# Patient Record
Sex: Female | Born: 1950
Health system: Southern US, Community
[De-identification: ages and names within clinical notes are randomized; demographics above are authoritative.]

## PROBLEM LIST (undated history)

## (undated) DIAGNOSIS — K219 Gastro-esophageal reflux disease without esophagitis: Secondary | ICD-10-CM

## (undated) HISTORY — PX: EYE SURGERY: SHX253

## (undated) HISTORY — PX: ABDOMINAL HYSTERECTOMY: SHX81

## (undated) HISTORY — PX: LAPAROSCOPIC OOPHERECTOMY: SHX6507

## (undated) HISTORY — DX: Gastro-esophageal reflux disease without esophagitis: K21.9

---

## 1981-02-17 HISTORY — PX: TYMPANOPLASTY: SHX33

## 2004-11-28 ENCOUNTER — Ambulatory Visit: Payer: Self-pay | Admitting: Internal Medicine

## 2004-11-29 ENCOUNTER — Ambulatory Visit: Payer: Self-pay | Admitting: Internal Medicine

## 2006-05-01 ENCOUNTER — Ambulatory Visit: Payer: Self-pay | Admitting: Unknown Physician Specialty

## 2010-04-25 ENCOUNTER — Ambulatory Visit: Payer: Self-pay | Admitting: Family Medicine

## 2011-04-21 ENCOUNTER — Ambulatory Visit: Payer: Self-pay | Admitting: Ophthalmology

## 2011-05-19 ENCOUNTER — Ambulatory Visit: Payer: Self-pay | Admitting: Ophthalmology

## 2011-06-11 ENCOUNTER — Ambulatory Visit: Payer: Self-pay | Admitting: Family Medicine

## 2011-06-19 ENCOUNTER — Ambulatory Visit: Payer: Self-pay | Admitting: Family Medicine

## 2011-12-25 ENCOUNTER — Ambulatory Visit: Payer: Self-pay | Admitting: Family Medicine

## 2013-02-07 ENCOUNTER — Ambulatory Visit: Payer: Self-pay | Admitting: Family Medicine

## 2014-02-14 ENCOUNTER — Ambulatory Visit: Payer: Self-pay | Admitting: Family Medicine

## 2014-06-11 NOTE — Op Note (Signed)
PATIENT NAME:  Christina Webb, Christina Webb MR#:  [id number] DATE OF BIRTH:  20-Jul-1950  DATE OF PROCEDURE:  04/21/2011  PREOPERATIVE DIAGNOSIS:  Cataract, right eye.   POSTOPERATIVE DIAGNOSIS:  Cataract, right eye.  PROCEDURE PERFORMED:  Extracapsular cataract extraction using phacoemulsification with placement of an Alcon SN6CWS, 12.0-diopter posterior chamber lens, serial # [id number].  SURGEON:  Maylon PeppersSteven A. Shiv Shuey, MD  ASSISTANT:  None.  ANESTHESIA:  4% lidocaine and 0.75% Marcaine in a 50/50 mixture with 10 units/mL of Hylenex added, given as a peribulbar.   ANESTHESIOLOGIST:  Dr. Dimple Caseyice  COMPLICATIONS:  None.  ESTIMATED BLOOD LOSS:  Less than 1 ml.  DESCRIPTION OF PROCEDURE:  The patient was brought to the operating room and given a peribulbar block.  The patient was then prepped and draped in the usual fashion.  The vertical rectus muscles were imbricated using 5-0 silk sutures.  These sutures were then clamped to the sterile drapes as bridle sutures.  A limbal peritomy was performed extending two clock hours and hemostasis was obtained with cautery.  A partial thickness scleral groove was made at the surgical limbus and dissected anteriorly in a lamellar dissection using an Alcon crescent knife.  The anterior chamber was entered superonasally with a Superblade and through the lamellar dissection with a 2.6 mm keratome.  DisCoVisc was used to replace the aqueous and a continuous tear capsulorrhexis was carried out.  Hydrodissection and hydrodelineation were carried out with balanced salt and a 27 gauge canula.  The nucleus was rotated to confirm the effectiveness of the hydrodissection.  Phacoemulsification was carried out using a divide-and-conquer technique.  Total ultrasound time was 1 minute and 43.9 seconds with an average power of 20.3 percent and CDE of 32.49.  Irrigation/aspiration was used to remove the residual cortex.  DisCoVisc was used to inflate the capsule and the internal  incision was enlarged to 3 mm with the crescent knife.  The intraocular lens was folded and inserted into the capsular bag using the AcrySert delivery system. Irrigation/aspiration was used to remove the residual DisCoVisc.  Miostat was injected into the anterior chamber through the paracentesis track to inflate the anterior chamber and induce miosis.  The wound was checked for leaks and none were found. The conjunctiva was closed with cautery and the bridle sutures were removed.  Two drops of 0.3% Vigamox were placed on the eye.   An eye shield was placed on the eye.  The patient was discharged to the recovery room in good condition.  ____________________________ Maylon PeppersSteven A. Gustaf Mccarter, MD sad:slb D: 04/21/2011 13:10:58 ET T: 04/21/2011 13:49:35 ET JOB#: [id number]  cc: Christina SpareSteven A. Roneshia Drew, MD, <Dictator> Erline LevineSTEVEN A Alesana Magistro MD ELECTRONICALLY SIGNED 04/22/2011 14:18

## 2014-06-11 NOTE — Op Note (Signed)
PATIENT NAME:  Christina OchsHOMPSON, Jakyrah MR#:  [id number] DATE OF BIRTH:  28-Oct-1950  DATE OF PROCEDURE:  05/19/2011  PREOPERATIVE DIAGNOSIS:  Cataract, left eye.    POSTOPERATIVE DIAGNOSIS:  Cataract, left eye.  PROCEDURE PERFORMED:  Extracapsular cataract extraction using phacoemulsification with placement of an Alcon SN6CWS, 12.0-diopter posterior chamber lens, serial # [id number].  SURGEON:  Maylon PeppersSteven A. Bianca Raneri, MD  ASSISTANT:  None.  ANESTHESIA:  4% lidocaine and 0.75% Marcaine in a 50/50 mixture with 10 units per mL of Hylenex added, given as a peribulbar.  ANESTHESIOLOGIST:  Dr. Henrene HawkingKephart   COMPLICATIONS:  None.  ESTIMATED BLOOD LOSS:  Less than 1 mL.  DESCRIPTION OF PROCEDURE:  The patient was brought to the operating room and given a peribulbar block.  The patient was then prepped and draped in the usual fashion.  The vertical rectus muscles were imbricated using 5-0 silk sutures.  These sutures were then clamped to the sterile drapes as bridle sutures.  A limbal peritomy was performed extending two clock hours and hemostasis was obtained with cautery.  A partial thickness scleral groove was made at the surgical limbus and dissected anteriorly in a lamellar dissection using an Alcon crescent knife.  The anterior chamber was entered supero-temporally with a Superblade and through the lamellar dissection with a 2.6 mm keratome.  DisCoVisc was used to replace the aqueous and a continuous tear capsulorrhexis was carried out.  Hydrodissection and hydrodelineation were carried out with balanced salt and a 27 gauge canula.  The nucleus was rotated to confirm the effectiveness of the hydrodissection.  Phacoemulsification was carried out using a divide-and-conquer technique.  Total ultrasound time was 1 minute and 30 seconds with an average power of 23.9 percent, CDE 33.82.  Irrigation/aspiration was used to remove the residual cortex.  DisCoVisc was used to inflate the capsule and the internal  incision was enlarged to 3 mm with the crescent knife.  The intraocular lens was folded and inserted into the capsular bag using the AcrySert delivery system.  Irrigation/aspiration was used to remove the residual DisCoVisc.  Miostat was injected into the anterior chamber through the paracentesis track to inflate the anterior chamber and induce miosis.  The wound was checked for leaks and none were found. The conjunctiva was closed with cautery and the bridle sutures were removed.  Two drops of 0.3% Vigamox were placed on the eye.   An eye shield was placed on the eye.  The patient was discharged to the recovery room in good condition.  ____________________________ Maylon PeppersSteven A. Jai Steil, MD sad:drc D: 05/19/2011 13:12:27 ET T: 05/19/2011 13:52:51 ET JOB#: [id number]  cc: Viviann SpareSteven A. Dovey Fatzinger, MD, <Dictator> Erline LevineSTEVEN A Sujata Maines MD ELECTRONICALLY SIGNED 05/20/2011 13:56

## 2015-01-23 ENCOUNTER — Other Ambulatory Visit: Payer: Self-pay | Admitting: Family Medicine

## 2015-01-23 DIAGNOSIS — Z1231 Encounter for screening mammogram for malignant neoplasm of breast: Secondary | ICD-10-CM

## 2015-02-26 ENCOUNTER — Ambulatory Visit: Payer: Self-pay

## 2015-03-06 ENCOUNTER — Emergency Department
Admission: EM | Admit: 2015-03-06 | Discharge: 2015-03-06 | Disposition: A | Discharge status: Home / Self Care | Payer: 59 | Attending: Emergency Medicine | Admitting: Emergency Medicine

## 2015-03-06 ENCOUNTER — Emergency Department: Payer: 59

## 2015-03-06 ENCOUNTER — Encounter: Payer: Self-pay | Admitting: Emergency Medicine

## 2015-03-06 DIAGNOSIS — R079 Chest pain, unspecified: Secondary | ICD-10-CM | POA: Diagnosis present

## 2015-03-06 DIAGNOSIS — Z7982 Long term (current) use of aspirin: Secondary | ICD-10-CM | POA: Insufficient documentation

## 2015-03-06 LAB — BASIC METABOLIC PANEL
Anion gap: 6 (ref 5–15)
BUN: 20 mg/dL (ref 6–20)
CHLORIDE: 109 mmol/L (ref 101–111)
CO2: 27 mmol/L (ref 22–32)
CREATININE: 0.55 mg/dL (ref 0.44–1.00)
Calcium: 9.3 mg/dL (ref 8.9–10.3)
GFR calc Af Amer: >60 mL/min (ref >60)
GLUCOSE: 119 mg/dL — AB (ref 65–99)
Potassium: 3.5 mmol/L (ref 3.5–5.1)
SODIUM: 142 mmol/L (ref 135–145)

## 2015-03-06 LAB — CBC
HEMATOCRIT: 41.3 % (ref 35.0–47.0)
Hemoglobin: 13.6 g/dL (ref 12.0–16.0)
MCH: 29.3 pg (ref 26.0–34.0)
MCHC: 33 g/dL (ref 32.0–36.0)
MCV: 88.9 fL (ref 80.0–100.0)
PLATELETS: 218 10*3/uL (ref 150–440)
RBC: 4.65 MIL/uL (ref 3.80–5.20)
RDW: 13.1 % (ref 11.5–14.5)
WBC: 5.4 10*3/uL (ref 3.6–11.0)

## 2015-03-06 LAB — TROPONIN I
Troponin I: <0.03 ng/mL (ref <0.031)
Troponin I: <0.03 ng/mL (ref <0.031)

## 2015-03-06 NOTE — ED Notes (Signed)

## 2015-03-06 NOTE — ED Notes (Addendum)
Pt c/o intermittent left sided chest pain today.  Denies any other sx with chest pain.  Was at [facility] for CP and they sent her here.  Denies cardiac hx.

## 2015-03-06 NOTE — Discharge Instructions (Signed)

## 2015-03-06 NOTE — ED Provider Notes (Signed)
Time Seen: Approximately 2040  I have reviewed the triage notes  Chief Complaint: Chest Pain   History of Present Illness: Christina Webb is a 65 y.o. female *who states brief intermittent left-sided chest discomfort that started earlier this morning. She states the characteristics of her pain seemed to be consistent with her gastroesophageal reflux disease. She states it seemed to go away just as quickly as arrived and describes it as brief and transient. She states then she had another episode at 3:30 and went to an urgent care center and referred here to the emergency department. She states also the second episode was transient in nature she denies any lightheadedness, weakness, shortness of breath, diaphoresis, or radiation of her discomfort. She states her pain is resolved at this time and dad denies any past cardiac history. She denies any radiation of pain to the arm, jaw, neck region. History reviewed. No pertinent past medical history.  There are no active problems to display for this patient.   Past Surgical History  Procedure Laterality Date  . Abdominal hysterectomy    . Cesarean section    . Laparoscopic oopherectomy      Past Surgical History  Procedure Laterality Date  . Abdominal hysterectomy    . Cesarean section    . Laparoscopic oopherectomy      Current Outpatient Rx  Name  Route  Sig  Dispense  Refill  . aspirin EC 81 MG tablet   Oral   Take 81 mg by mouth at bedtime.         Marland Kitchen omeprazole (PRILOSEC) 20 MG capsule   Oral   Take 20 mg by mouth daily as needed (for acid reflux).           Allergies:  Review of patient's allergies indicates no known allergies.  Family History: History reviewed. No pertinent family history.  Social History: Social History  Substance Use Topics  . Smoking status: Never Smoker   . Smokeless tobacco: None  . Alcohol Use: No     Review of Systems:   10 point review of systems was performed and was  otherwise negative:  Constitutional: No fever Eyes: No visual disturbances ENT: No sore throat, ear pain Cardiac: No chest pain Respiratory: No shortness of breath, wheezing, or stridor Abdomen: No abdominal pain, no vomiting, No diarrhea Endocrine: No weight loss, No night sweats Extremities: No peripheral edema, cyanosis Skin: No rashes, easy bruising Neurologic: No focal weakness, trouble with speech or swollowing Urologic: No dysuria, Hematuria, or urinary frequency   Physical Exam:  ED Triage Vitals  Enc Vitals Group     BP 03/06/15 1711 154/87 mmHg     Pulse Rate 03/06/15 1711 91     Resp 03/06/15 1711 18     Temp 03/06/15 1711 97.9 F (36.6 C)     Temp Source 03/06/15 1711 Oral     SpO2 03/06/15 1711 99 %     Weight 03/06/15 1711 140 lb (63.504 kg)     Height 03/06/15 1711 5' 5.5" (1.664 m)     Head Cir --      Peak Flow --      Pain Score 03/06/15 1712 6     Pain Loc --      Pain Edu? --      Excl. in GC? --     General: Awake , Alert , and Oriented times 3; GCS 15 Head: Normal cephalic , atraumatic Eyes: Pupils equal , round, reactive to light  Nose/Throat: No nasal drainage, patent upper airway without erythema or exudate.  Neck: Supple, Full range of motion, No anterior adenopathy or palpable thyroid masses Lungs: Clear to ascultation without wheezes , rhonchi, or rales Heart: Regular rate, regular rhythm without murmurs , gallops , or rubs Abdomen: Soft, non tender without rebound, guarding , or rigidity; bowel sounds positive and symmetric in all 4 quadrants. No organomegaly .        Extremities: 2 plus symmetric pulses. No edema, clubbing or cyanosis Neurologic: normal ambulation, Motor symmetric without deficits, sensory intact Skin: warm, dry, no rashes   Labs:   All laboratory work was reviewed including any pertinent negatives or positives listed below:  Labs Reviewed  BASIC METABOLIC PANEL - Abnormal; Notable for the following:    Glucose, Bld  119 (*)    All other components within normal limits  CBC  TROPONIN I  TROPONIN I   Review of laboratory work including serial troponins was negative EKG:  ED ECG REPORT I, Jennye Moccasin, the attending physician, personally viewed and interpreted this ECG.  Date: 03/06/2015 EKG Time: 1721 Rate: *97 Rhythm: normal sinus rhythm QRS Axis: normal Intervals: normal ST/T Wave abnormalities: Nonspecific ST-T wave abnormality Conduction Disutrbances: none Narrative Interpretation: unremarkable No acute ischemic changes   Radiology:  CLINICAL DATA: Left-sided chest pain  EXAM: CHEST 2 VIEW  COMPARISON: None.  FINDINGS: There is no edema or consolidation. The heart size and pulmonary vascularity are normal. No pneumothorax. No adenopathy. No bone lesions.  IMPRESSION: No edema or consolidation.   Electronically Signed    I personally reviewed the radiologic studies     ED Course:  Differential includes all life-threatening causes for chest pain. This includes but is not exclusive to acute coronary syndrome, aortic dissection, pulmonary embolism, cardiac tamponade, community-acquired pneumonia, pericarditis, musculoskeletal chest wall pain, etc. Given the patient's current clinical presentation and objective findings I felt this was unlikely to be a life-threatening cause for her chest discomfort. She describes the discomfort is similar to previous gastroesophageal reflux though was more toward the left side. The patient's serial troponins and EKG show no indications of acute coronary syndrome and she clinically does not present with other life-threatening causes such as pulmonary embolism, etc. Was advised workup is not complete and she is to follow-up with her primary physician and/or cardiologist for definitive outpatient evaluation    Assessment:  Acute unspecified chest pain  Final Clinical Impression:   Final diagnoses:  Chest pain, unspecified chest pain  type     Plan:  Outpatient management Patient was advised to return immediately if condition worsens. Patient was advised to follow up with their primary care physician or other specialized physicians involved in their outpatient care             Jennye Moccasin, MD 03/06/15 2224

## 2015-03-06 NOTE — ED Notes (Signed)
Pt presents to ED with c/o left sided intermittent chest pain since this morning, pt reports "I thought it was my acid reflux but it got worse this afternoon about 3pm" Pt denies chest pain at this time. Pt denies dizziness, weakness, shortness of breath, or blurry vision. Pt was seen by PCP and referred to ED for further evaluation.  Pt alert and oriented x 4, skin warm and dry, no increased work in breathing noted. Spouse at bedside. Call bell within reach.

## 2015-03-12 ENCOUNTER — Ambulatory Visit
Admission: RE | Admit: 2015-03-12 | Discharge: 2015-03-12 | Disposition: A | Discharge status: Home / Self Care | Payer: 59 | Source: Ambulatory Visit | Attending: Family Medicine | Admitting: Family Medicine

## 2015-03-12 ENCOUNTER — Encounter: Payer: Self-pay | Admitting: Family Medicine

## 2015-03-12 DIAGNOSIS — Z1231 Encounter for screening mammogram for malignant neoplasm of breast: Secondary | ICD-10-CM | POA: Insufficient documentation

## 2015-06-04 ENCOUNTER — Encounter: Payer: Self-pay | Admitting: Family Medicine

## 2015-06-04 ENCOUNTER — Ambulatory Visit (INDEPENDENT_AMBULATORY_CARE_PROVIDER_SITE_OTHER): Payer: 59 | Admitting: Family Medicine

## 2015-06-04 VITALS — BP 126/85 | HR 69 | Temp 97.6°F | Ht 63.8 in | Wt 144.0 lb

## 2015-06-04 DIAGNOSIS — Z0000 Encounter for general adult medical examination without abnormal findings: Secondary | ICD-10-CM

## 2015-06-04 DIAGNOSIS — K219 Gastro-esophageal reflux disease without esophagitis: Secondary | ICD-10-CM | POA: Diagnosis not present

## 2015-06-04 LAB — URINALYSIS, ROUTINE W REFLEX MICROSCOPIC
Bilirubin, UA: NEGATIVE
Glucose, UA: NEGATIVE
Ketones, UA: NEGATIVE
Nitrite, UA: NEGATIVE
Protein, UA: NEGATIVE
RBC, UA: NEGATIVE
Specific Gravity, UA: 1.02 (ref 1.005–1.030)
Urobilinogen, Ur: 0.2 mg/dL (ref 0.2–1.0)
pH, UA: 7 (ref 5.0–7.5)

## 2015-06-04 LAB — MICROSCOPIC EXAMINATION

## 2015-06-04 MED ORDER — OMEPRAZOLE 20 MG PO CPDR: 20.0000 mg | DELAYED_RELEASE_CAPSULE | Freq: Every day | ORAL | Status: DC | PRN

## 2015-06-04 NOTE — Progress Notes (Signed)
BP 126/85 mmHg  Pulse 69  Temp(Src) 97.6 F (36.4 C)  Ht 5' 3.8" (1.621 m)  Wt 144 lb (65.318 kg)  BMI 24.86 kg/m2  SpO2 99%   Subjective:    Patient ID: Christina Webb, female    DOB: 1950-04-27, 65 y.o.   MRN: [id number]  HPI: Christina Webb is a 65 y.o. female  Chief Complaint  Patient presents with  . Annual Exam    all in all doing well no complaints takes occasional Prilosec  Relevant past medical, surgical, family and social history reviewed and updated as indicated. Interim medical history since our last visit reviewed. Allergies and medications reviewed and updated.  Review of Systems  Constitutional: Negative.   HENT: Negative.   Eyes: Negative.   Respiratory: Negative.   Cardiovascular: Negative.   Gastrointestinal: Negative.   Endocrine: Negative.   Genitourinary: Negative.   Musculoskeletal: Negative.   Skin: Negative.   Allergic/Immunologic: Negative.   Neurological: Negative.   Hematological: Negative.   Psychiatric/Behavioral: Negative.     Per HPI unless specifically indicated above     Objective:    BP 126/85 mmHg  Pulse 69  Temp(Src) 97.6 F (36.4 C)  Ht 5' 3.8" (1.621 m)  Wt 144 lb (65.318 kg)  BMI 24.86 kg/m2  SpO2 99%  Wt Readings from Last 3 Encounters:  06/04/15 144 lb (65.318 kg)  05/31/14 148 lb (67.132 kg)  03/06/15 140 lb (63.504 kg)    Physical Exam  Constitutional: She is oriented to person, place, and time. She appears well-developed and well-nourished.  HENT:  Head: Normocephalic and atraumatic.  Right Ear: External ear normal.  Left Ear: External ear normal.  Nose: Nose normal.  Mouth/Throat: Oropharynx is clear and moist.  Eyes: Conjunctivae and EOM are normal. Pupils are equal, round, and reactive to light.  Neck: Normal range of motion. Neck supple. Carotid bruit is not present.  Cardiovascular: Normal rate, regular rhythm and normal heart sounds.   No murmur heard. Pulmonary/Chest: Effort normal and  breath sounds normal. She exhibits no mass. Right breast exhibits no mass, no skin change and no tenderness. Left breast exhibits no mass, no skin change and no tenderness. Breasts are symmetrical.  Abdominal: Soft. Bowel sounds are normal. There is no hepatosplenomegaly.  Musculoskeletal: Normal range of motion.  Neurological: She is alert and oriented to person, place, and time.  Skin: No rash noted.  Multiple moles has not had skin check for years  Psychiatric: She has a normal mood and affect. Her behavior is normal. Judgment and thought content normal.        Assessment & Plan:   Problem List Items Addressed This Visit      Digestive   GERD (gastroesophageal reflux disease)   Relevant Medications   omeprazole (PRILOSEC) 20 MG capsule    Other Visit Diagnoses    Routine general medical examination at a health care facility    -  Primary    Relevant Orders    CBC with Differential/Platelet    Comprehensive metabolic panel    Lipid Panel w/o Chol/HDL Ratio    TSH    Urinalysis, Routine w reflex microscopic ([facility])    Comprehensive metabolic panel    CBC with Differential/Platelet    Lipid panel    TSH    Urinalysis, Routine w reflex microscopic ([facility])       Patient will self refer to dermatology wants to see Dr. Gwen PoundsKowalski Follow up plan: Return  for Physical Exam.

## 2015-06-05 ENCOUNTER — Encounter: Payer: Self-pay | Admitting: Family Medicine

## 2015-06-05 LAB — COMPREHENSIVE METABOLIC PANEL
A/G RATIO: 2 (ref 1.2–2.2)
ALT: 11 IU/L (ref 0–32)
AST: 14 IU/L (ref 0–40)
Albumin: 4.3 g/dL (ref 3.6–4.8)
Alkaline Phosphatase: 91 IU/L (ref 39–117)
BUN/Creatinine Ratio: 26 (ref 12–28)
BUN: 18 mg/dL (ref 8–27)
Bilirubin Total: 0.5 mg/dL (ref 0.0–1.2)
CHLORIDE: 106 mmol/L (ref 96–106)
CO2: 23 mmol/L (ref 18–29)
Calcium: 9.1 mg/dL (ref 8.7–10.3)
Creatinine, Ser: 0.69 mg/dL (ref 0.57–1.00)
GFR calc non Af Amer: 94 mL/min/{1.73_m2} (ref >59)
GFR, EST AFRICAN AMERICAN: 108 mL/min/{1.73_m2} (ref >59)
Globulin, Total: 2.2 g/dL (ref 1.5–4.5)
Glucose: 98 mg/dL (ref 65–99)
POTASSIUM: 4 mmol/L (ref 3.5–5.2)
SODIUM: 144 mmol/L (ref 134–144)
Total Protein: 6.5 g/dL (ref 6.0–8.5)

## 2015-06-05 LAB — CBC WITH DIFFERENTIAL/PLATELET
BASOS: 1 %
Basophils Absolute: 0 10*3/uL (ref 0.0–0.2)
EOS (ABSOLUTE): 0.3 10*3/uL (ref 0.0–0.4)
Eos: 9 %
Hematocrit: 39.2 % (ref 34.0–46.6)
Hemoglobin: 13.1 g/dL (ref 11.1–15.9)
Immature Grans (Abs): 0 10*3/uL (ref 0.0–0.1)
Immature Granulocytes: 0 %
LYMPHS ABS: 1.4 10*3/uL (ref 0.7–3.1)
Lymphs: 38 %
MCH: 29.4 pg (ref 26.6–33.0)
MCHC: 33.4 g/dL (ref 31.5–35.7)
MCV: 88 fL (ref 79–97)
MONOS ABS: 0.2 10*3/uL (ref 0.1–0.9)
Monocytes: 6 %
NEUTROS ABS: 1.7 10*3/uL (ref 1.4–7.0)
NEUTROS PCT: 46 %
PLATELETS: 234 10*3/uL (ref 150–379)
RBC: 4.46 x10E6/uL (ref 3.77–5.28)
RDW: 13 % (ref 12.3–15.4)
WBC: 3.7 10*3/uL (ref 3.4–10.8)

## 2015-06-05 LAB — LIPID PANEL W/O CHOL/HDL RATIO
CHOLESTEROL TOTAL: 148 mg/dL (ref 100–199)
HDL: 47 mg/dL (ref >39)
LDL Calculated: 87 mg/dL (ref 0–99)
Triglycerides: 69 mg/dL (ref 0–149)
VLDL Cholesterol Cal: 14 mg/dL (ref 5–40)

## 2015-06-05 LAB — TSH: TSH: 2.31 u[IU]/mL (ref 0.450–4.500)

## 2016-01-30 ENCOUNTER — Other Ambulatory Visit: Payer: Self-pay | Admitting: Family Medicine

## 2016-01-30 DIAGNOSIS — Z1231 Encounter for screening mammogram for malignant neoplasm of breast: Secondary | ICD-10-CM

## 2016-03-17 ENCOUNTER — Ambulatory Visit: Payer: 59

## 2016-04-14 ENCOUNTER — Ambulatory Visit
Admission: RE | Admit: 2016-04-14 | Discharge: 2016-04-14 | Disposition: A | Discharge status: Home / Self Care | Payer: 59 | Source: Ambulatory Visit | Attending: Family Medicine | Admitting: Family Medicine

## 2016-04-14 DIAGNOSIS — Z1231 Encounter for screening mammogram for malignant neoplasm of breast: Secondary | ICD-10-CM | POA: Insufficient documentation

## 2016-06-09 ENCOUNTER — Ambulatory Visit (INDEPENDENT_AMBULATORY_CARE_PROVIDER_SITE_OTHER): Payer: 59 | Admitting: Family Medicine

## 2016-06-09 ENCOUNTER — Encounter: Payer: Self-pay | Admitting: Family Medicine

## 2016-06-09 VITALS — BP 125/81 | HR 98 | Ht 65.35 in | Wt 147.0 lb

## 2016-06-09 DIAGNOSIS — Z23 Encounter for immunization: Secondary | ICD-10-CM

## 2016-06-09 DIAGNOSIS — Z13220 Encounter for screening for lipoid disorders: Secondary | ICD-10-CM | POA: Diagnosis not present

## 2016-06-09 DIAGNOSIS — Z1329 Encounter for screening for other suspected endocrine disorder: Secondary | ICD-10-CM | POA: Diagnosis not present

## 2016-06-09 DIAGNOSIS — Z0000 Encounter for general adult medical examination without abnormal findings: Secondary | ICD-10-CM | POA: Diagnosis not present

## 2016-06-09 LAB — URINALYSIS, ROUTINE W REFLEX MICROSCOPIC
Bilirubin, UA: NEGATIVE
Glucose, UA: NEGATIVE
Ketones, UA: NEGATIVE
NITRITE UA: NEGATIVE
Specific Gravity, UA: >1.03 — ABNORMAL HIGH (ref 1.005–1.030)
UUROB: 0.2 mg/dL (ref 0.2–1.0)
pH, UA: 6 (ref 5.0–7.5)

## 2016-06-09 LAB — MICROSCOPIC EXAMINATION: BACTERIA UA: NONE SEEN

## 2016-06-09 MED ORDER — OMEPRAZOLE 20 MG PO CPDR: 20.0000 mg | DELAYED_RELEASE_CAPSULE | Freq: Every day | ORAL | 4 refills | Status: DC | PRN

## 2016-06-09 NOTE — Progress Notes (Signed)
BP 125/81   Pulse 98   Ht 5' 5.35" (1.66 m)   Wt 147 lb (66.7 kg)   SpO2 96%   BMI 24.20 kg/m    Subjective:    Patient ID: Christina Webb, female    DOB: 11/04/1950, 66 y.o.   MRN: [id number]  HPI: Christina Webb is a 66 y.o. female  Chief Complaint  Patient presents with  . Annual Exam  Patient all in all doing well no complaints takes occasional Prilosec and aspirin and otherwise doing well needs a pertussis vaccine is having a grandbaby coming seen. Otherwise doing well.  Relevant past medical, surgical, family and social history reviewed and updated as indicated. Interim medical history since our last visit reviewed. Allergies and medications reviewed and updated.  Review of Systems  Constitutional: Negative.   HENT: Negative.   Eyes: Negative.   Respiratory: Negative.   Cardiovascular: Negative.   Gastrointestinal: Negative.   Endocrine: Negative.   Genitourinary: Negative.   Musculoskeletal: Negative.   Skin: Negative.   Allergic/Immunologic: Negative.   Neurological: Negative.   Hematological: Negative.   Psychiatric/Behavioral: Negative.     Per HPI unless specifically indicated above     Objective:    BP 125/81   Pulse 98   Ht 5' 5.35" (1.66 m)   Wt 147 lb (66.7 kg)   SpO2 96%   BMI 24.20 kg/m   Wt Readings from Last 3 Encounters:  06/09/16 147 lb (66.7 kg)  06/04/15 144 lb (65.3 kg)  05/31/14 148 lb (67.1 kg)    Physical Exam  Constitutional: She is oriented to person, place, and time. She appears well-developed and well-nourished.  HENT:  Head: Normocephalic and atraumatic.  Right Ear: External ear normal.  Left Ear: External ear normal.  Nose: Nose normal.  Mouth/Throat: Oropharynx is clear and moist.  Eyes: Conjunctivae and EOM are normal. Pupils are equal, round, and reactive to light.  Neck: Normal range of motion. Neck supple. Carotid bruit is not present.  Cardiovascular: Normal rate, regular rhythm and normal heart sounds.    No murmur heard. Pulmonary/Chest: Effort normal and breath sounds normal. She exhibits no mass. Right breast exhibits no mass, no skin change and no tenderness. Left breast exhibits no mass, no skin change and no tenderness. Breasts are symmetrical.  Abdominal: Soft. Bowel sounds are normal. There is no hepatosplenomegaly.  Musculoskeletal: Normal range of motion.  Neurological: She is alert and oriented to person, place, and time.  Skin: No rash noted.  Psychiatric: She has a normal mood and affect. Her behavior is normal. Judgment and thought content normal.    Results for orders placed or performed in visit on 06/04/15  Microscopic Examination  Result Value Ref Range   WBC, UA 0-5 0 - 5 /hpf   RBC, UA 0-2 0 - 2 /hpf   Epithelial Cells (non renal) 0-10 0 - 10 /hpf   Mucus, UA Present Not Estab.   Bacteria, UA Few None seen/Few  CBC with Differential/Platelet  Result Value Ref Range   WBC 3.7 3.4 - 10.8 x10E3/uL   RBC 4.46 3.77 - 5.28 x10E6/uL   Hemoglobin 13.1 11.1 - 15.9 g/dL   Hematocrit 40.9 81.1 - 46.6 %   MCV 88 79 - 97 fL   MCH 29.4 26.6 - 33.0 pg   MCHC 33.4 31.5 - 35.7 g/dL   RDW 91.4 78.2 - 95.6 %   Platelets 234 150 - 379 x10E3/uL   Neutrophils 46 %  Lymphs 38 %   Monocytes 6 %   Eos 9 %   Basos 1 %   Neutrophils Absolute 1.7 1.4 - 7.0 x10E3/uL   Lymphocytes Absolute 1.4 0.7 - 3.1 x10E3/uL   Monocytes Absolute 0.2 0.1 - 0.9 x10E3/uL   EOS (ABSOLUTE) 0.3 0.0 - 0.4 x10E3/uL   Basophils Absolute 0.0 0.0 - 0.2 x10E3/uL   Immature Granulocytes 0 %   Immature Grans (Abs) 0.0 0.0 - 0.1 x10E3/uL  Comprehensive metabolic panel  Result Value Ref Range   Glucose 98 65 - 99 mg/dL   BUN 18 8 - 27 mg/dL   Creatinine, Ser 1.61 0.57 - 1.00 mg/dL   GFR calc non Af Amer 94 >59 mL/min/1.73   GFR calc Af Amer 108 >59 mL/min/1.73   BUN/Creatinine Ratio 26 12 - 28   Sodium 144 134 - 144 mmol/L   Potassium 4.0 3.5 - 5.2 mmol/L   Chloride 106 96 - 106 mmol/L   CO2 23 18 - 29  mmol/L   Calcium 9.1 8.7 - 10.3 mg/dL   Total Protein 6.5 6.0 - 8.5 g/dL   Albumin 4.3 3.6 - 4.8 g/dL   Globulin, Total 2.2 1.5 - 4.5 g/dL   Albumin/Globulin Ratio 2.0 1.2 - 2.2   Bilirubin Total 0.5 0.0 - 1.2 mg/dL   Alkaline Phosphatase 91 39 - 117 IU/L   AST 14 0 - 40 IU/L   ALT 11 0 - 32 IU/L  Lipid Panel w/o Chol/HDL Ratio  Result Value Ref Range   Cholesterol, Total 148 100 - 199 mg/dL   Triglycerides 69 0 - 149 mg/dL   HDL 47 >09 mg/dL   VLDL Cholesterol Cal 14 5 - 40 mg/dL   LDL Calculated 87 0 - 99 mg/dL  TSH  Result Value Ref Range   TSH 2.310 0.450 - 4.500 uIU/mL  Urinalysis, Routine w reflex microscopic (not at [facility])  Result Value Ref Range   Specific Gravity, UA 1.020 1.005 - 1.030   pH, UA 7.0 5.0 - 7.5   Color, UA Yellow Yellow   Appearance Ur Clear Clear   Leukocytes, UA 1+ (A) Negative   Protein, UA Negative Negative/Trace   Glucose, UA Negative Negative   Ketones, UA Negative Negative   RBC, UA Negative Negative   Bilirubin, UA Negative Negative   Urobilinogen, Ur 0.2 0.2 - 1.0 mg/dL   Nitrite, UA Negative Negative   Microscopic Examination See below:       Assessment & Plan:   Problem List Items Addressed This Visit    None    Visit Diagnoses    Routine general medical examination at a health care facility    -  Primary   Relevant Orders   CBC with Differential/Platelet   Comprehensive metabolic panel   Lipid panel   TSH   Urinalysis, Routine w reflex microscopic   Screening cholesterol level       Relevant Orders   Lipid panel   Thyroid disorder screen       Relevant Orders   TSH   Need for Tdap vaccination       Relevant Orders   Tdap vaccine greater than or equal to 7yo IM (Completed)       Follow up plan: Return in about 1 year (around 06/09/2017) for Physical Exam.

## 2016-06-10 ENCOUNTER — Encounter: Payer: Self-pay | Admitting: Family Medicine

## 2016-06-10 LAB — CBC WITH DIFFERENTIAL/PLATELET
BASOS ABS: 0 10*3/uL (ref 0.0–0.2)
Basos: 1 %
EOS (ABSOLUTE): 0.2 10*3/uL (ref 0.0–0.4)
Eos: 5 %
Hematocrit: 39.9 % (ref 34.0–46.6)
Hemoglobin: 13.5 g/dL (ref 11.1–15.9)
IMMATURE GRANULOCYTES: 0 %
Immature Grans (Abs): 0 10*3/uL (ref 0.0–0.1)
Lymphocytes Absolute: 1.6 10*3/uL (ref 0.7–3.1)
Lymphs: 41 %
MCH: 30 pg (ref 26.6–33.0)
MCHC: 33.8 g/dL (ref 31.5–35.7)
MCV: 89 fL (ref 79–97)
MONOS ABS: 0.2 10*3/uL (ref 0.1–0.9)
Monocytes: 6 %
Neutrophils Absolute: 1.8 10*3/uL (ref 1.4–7.0)
Neutrophils: 47 %
PLATELETS: 232 10*3/uL (ref 150–379)
RBC: 4.5 x10E6/uL (ref 3.77–5.28)
RDW: 13.2 % (ref 12.3–15.4)
WBC: 3.8 10*3/uL (ref 3.4–10.8)

## 2016-06-10 LAB — COMPREHENSIVE METABOLIC PANEL
ALT: 11 IU/L (ref 0–32)
AST: 18 IU/L (ref 0–40)
Albumin/Globulin Ratio: 2 (ref 1.2–2.2)
Albumin: 4.3 g/dL (ref 3.6–4.8)
Alkaline Phosphatase: 87 IU/L (ref 39–117)
BUN / CREAT RATIO: 25 (ref 12–28)
BUN: 18 mg/dL (ref 8–27)
Bilirubin Total: 0.5 mg/dL (ref 0.0–1.2)
CALCIUM: 9 mg/dL (ref 8.7–10.3)
CO2: 23 mmol/L (ref 18–29)
Chloride: 104 mmol/L (ref 96–106)
Creatinine, Ser: 0.71 mg/dL (ref 0.57–1.00)
GFR calc Af Amer: 105 mL/min/{1.73_m2} (ref >59)
GFR, EST NON AFRICAN AMERICAN: 91 mL/min/{1.73_m2} (ref >59)
GLUCOSE: 102 mg/dL — AB (ref 65–99)
Globulin, Total: 2.2 g/dL (ref 1.5–4.5)
POTASSIUM: 3.6 mmol/L (ref 3.5–5.2)
SODIUM: 140 mmol/L (ref 134–144)
Total Protein: 6.5 g/dL (ref 6.0–8.5)

## 2016-06-10 LAB — LIPID PANEL
CHOL/HDL RATIO: 3.2 ratio (ref 0.0–4.4)
Cholesterol, Total: 154 mg/dL (ref 100–199)
HDL: 48 mg/dL (ref >39)
LDL Calculated: 88 mg/dL (ref 0–99)
Triglycerides: 92 mg/dL (ref 0–149)
VLDL Cholesterol Cal: 18 mg/dL (ref 5–40)

## 2016-06-10 LAB — TSH: TSH: 2.28 u[IU]/mL (ref 0.450–4.500)

## 2016-06-25 LAB — COLOGUARD: Cologuard: NEGATIVE

## 2016-07-02 LAB — COLOGUARD: COLOGUARD: NEGATIVE

## 2016-07-08 ENCOUNTER — Telehealth: Payer: Self-pay

## 2016-07-08 NOTE — Telephone Encounter (Signed)
Pt called back and was notified of results

## 2016-07-08 NOTE — Telephone Encounter (Signed)
Attempted to notify pt of negative results for cologuard. No answer. VM left for pt to return call.

## 2016-08-25 ENCOUNTER — Telehealth: Payer: Self-pay | Admitting: Family Medicine

## 2016-08-25 MED ORDER — FLUCONAZOLE 150 MG PO TABS: 150.0000 mg | ORAL_TABLET | Freq: Once | ORAL | 1 refills | Status: AC

## 2016-08-25 NOTE — Telephone Encounter (Signed)
done

## 2016-08-25 NOTE — Telephone Encounter (Signed)
Please advise 

## 2016-08-25 NOTE — Telephone Encounter (Signed)
Patient called to see if Dr Dossie Arbourrissman would call her in the pill med for yeast infection. She states she has tried OTC meds which have not taken care of it.  [facility]acy [facility]   [place] [phone]    Thank you

## 2017-02-18 IMAGING — CR DG CHEST 2V
1 series · 2 of 2 positions shown · non-contrast
Comparison: None.

CLINICAL DATA: Left-sided chest pain

EXAM:
CHEST  2 VIEW

[Series 1: w chest pa · 0.14mm/px · 2 of 2 slices shown]
[im 1/2]
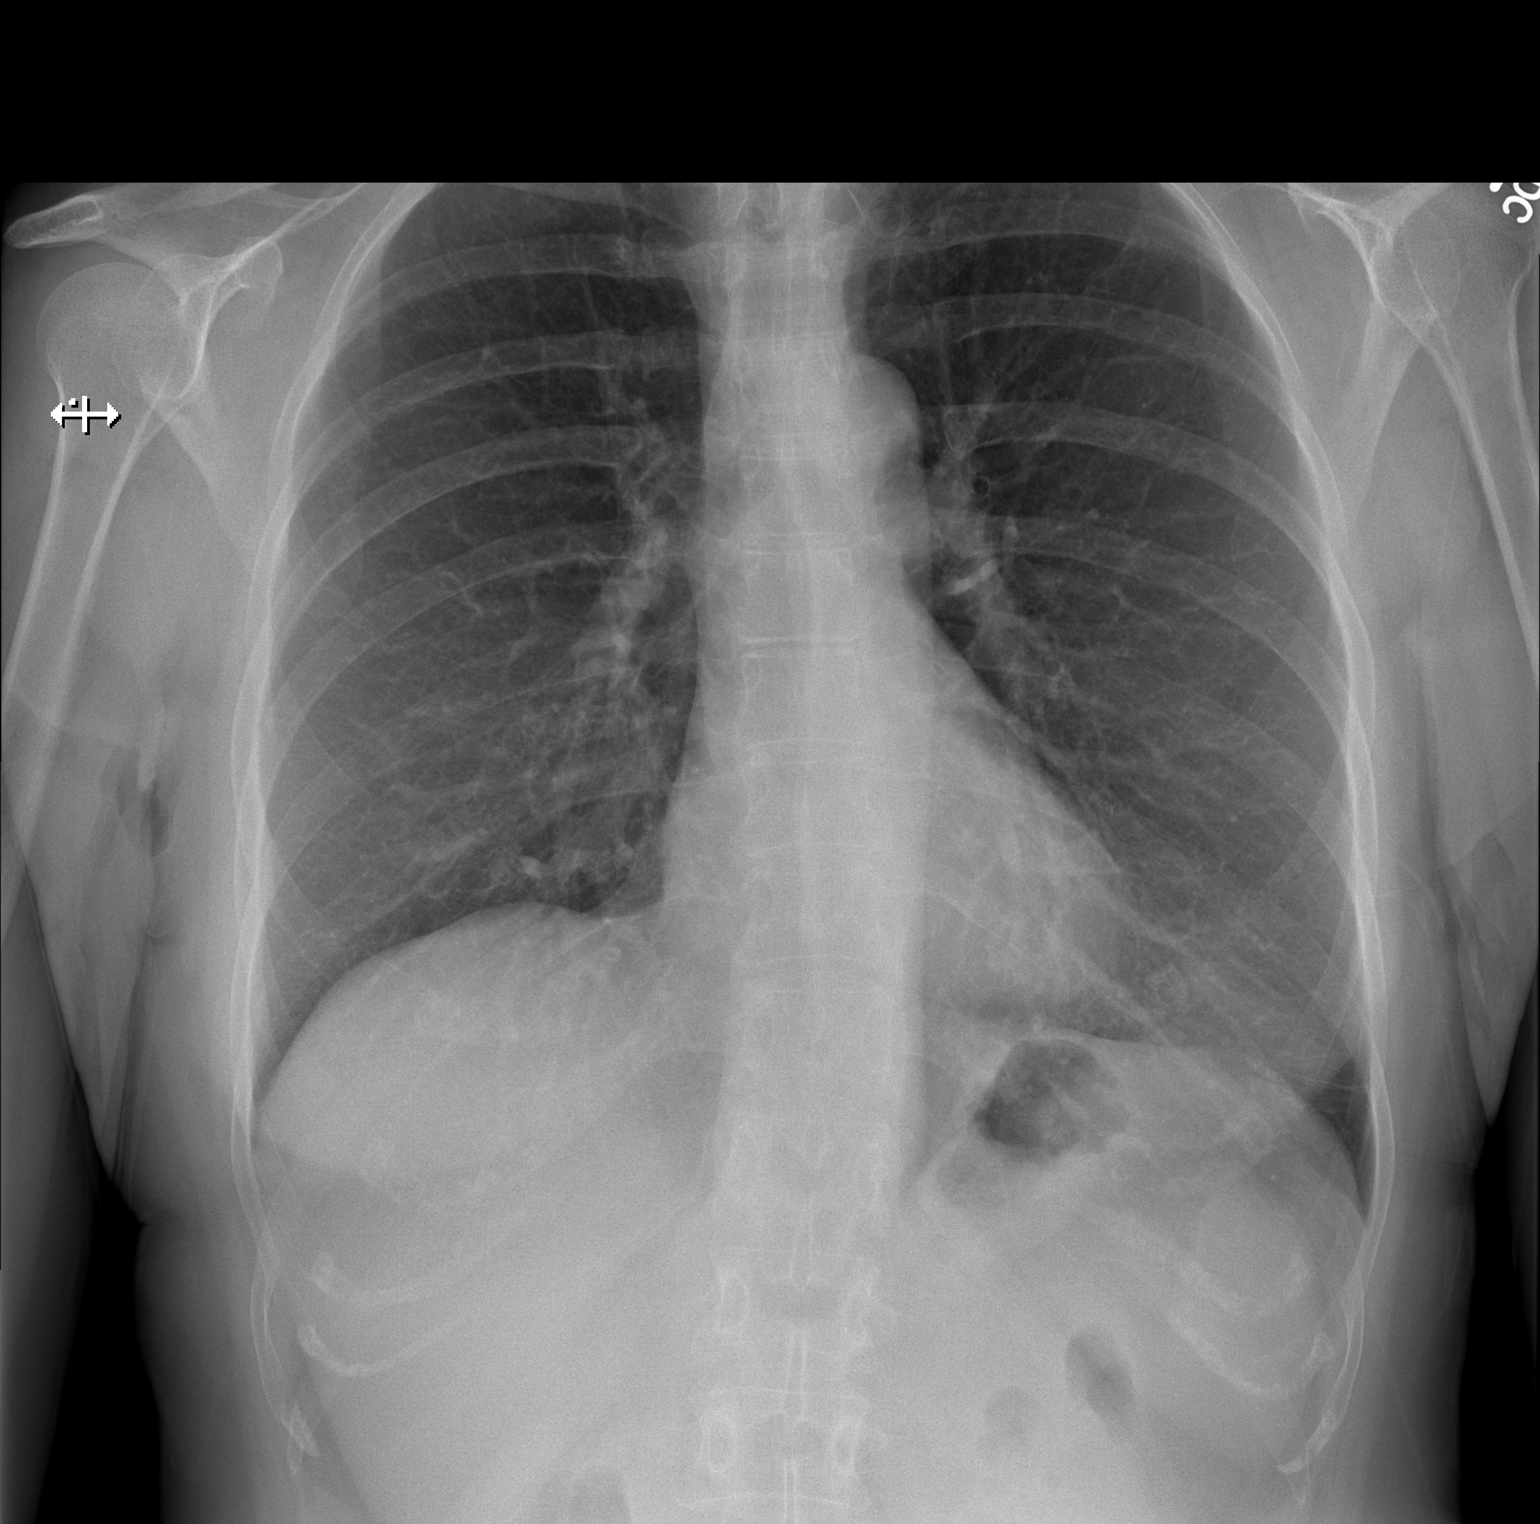
[im 2/2]
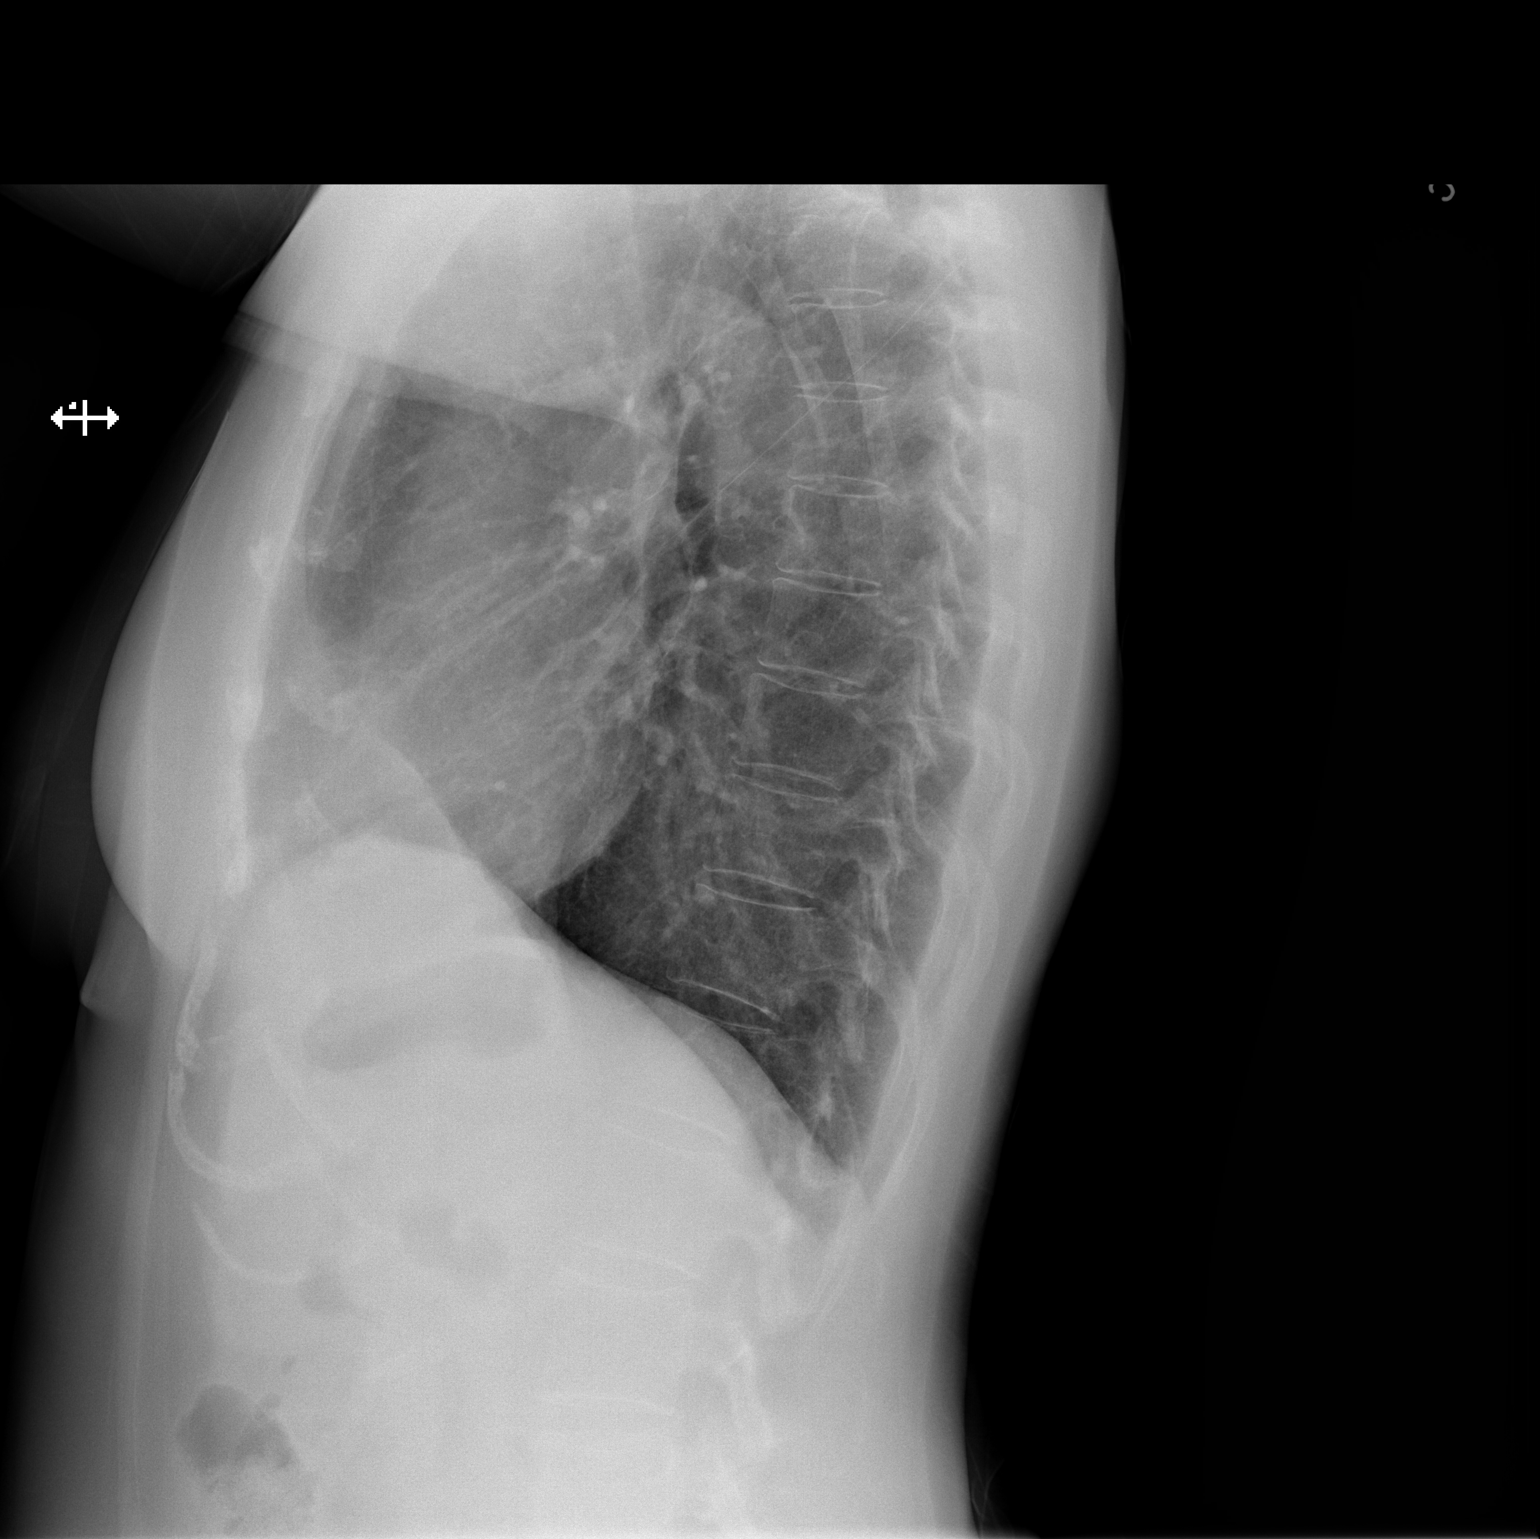

[2 of 2 positions shown; findings below may reference images not displayed]

FINDINGS: There is no edema or consolidation. The heart size and pulmonary
vascularity are normal. No pneumothorax. No adenopathy. No bone
lesions.
IMPRESSION: No edema or consolidation.

## 2017-03-05 ENCOUNTER — Other Ambulatory Visit: Payer: Self-pay | Admitting: Family Medicine

## 2017-06-08 ENCOUNTER — Ambulatory Visit (INDEPENDENT_AMBULATORY_CARE_PROVIDER_SITE_OTHER): Payer: Managed Care, Other (non HMO) | Admitting: Family Medicine

## 2017-06-08 ENCOUNTER — Encounter: Payer: Self-pay | Admitting: Family Medicine

## 2017-06-08 VITALS — BP 136/85 | HR 77 | Ht 64.25 in | Wt 146.0 lb

## 2017-06-08 DIAGNOSIS — Z1231 Encounter for screening mammogram for malignant neoplasm of breast: Secondary | ICD-10-CM | POA: Diagnosis not present

## 2017-06-08 DIAGNOSIS — H9193 Unspecified hearing loss, bilateral: Secondary | ICD-10-CM | POA: Diagnosis not present

## 2017-06-08 DIAGNOSIS — Z13220 Encounter for screening for lipoid disorders: Secondary | ICD-10-CM | POA: Diagnosis not present

## 2017-06-08 DIAGNOSIS — Z114 Encounter for screening for human immunodeficiency virus [HIV]: Secondary | ICD-10-CM | POA: Diagnosis not present

## 2017-06-08 DIAGNOSIS — Z0000 Encounter for general adult medical examination without abnormal findings: Secondary | ICD-10-CM | POA: Diagnosis not present

## 2017-06-08 DIAGNOSIS — Z1159 Encounter for screening for other viral diseases: Secondary | ICD-10-CM

## 2017-06-08 DIAGNOSIS — Z131 Encounter for screening for diabetes mellitus: Secondary | ICD-10-CM | POA: Diagnosis not present

## 2017-06-08 DIAGNOSIS — Z1329 Encounter for screening for other suspected endocrine disorder: Secondary | ICD-10-CM | POA: Diagnosis not present

## 2017-06-08 DIAGNOSIS — Z1239 Encounter for other screening for malignant neoplasm of breast: Secondary | ICD-10-CM

## 2017-06-08 LAB — MICROSCOPIC EXAMINATION: Bacteria, UA: NONE SEEN

## 2017-06-08 LAB — URINALYSIS, ROUTINE W REFLEX MICROSCOPIC
BILIRUBIN UA: NEGATIVE
GLUCOSE, UA: NEGATIVE
NITRITE UA: NEGATIVE
RBC UA: NEGATIVE
SPEC GRAV UA: 1.02 (ref 1.005–1.030)
Urobilinogen, Ur: 1 mg/dL (ref 0.2–1.0)
pH, UA: 6.5 (ref 5.0–7.5)

## 2017-06-08 NOTE — Assessment & Plan Note (Signed)
Doing well good reports

## 2017-06-08 NOTE — Progress Notes (Signed)
BP 136/85   Pulse 77   Ht 5' 4.25" (1.632 m)   Wt 146 lb (66.2 kg)   SpO2 98%   BMI 24.87 kg/m    Subjective:    Patient ID: Christina DecentElaine S Webb, female    DOB: [birth year], 67 y.o.   MRN: [id number]  HPI: Christina Webb is a 67 y.o. female  Chief Complaint  Patient presents with  . Annual Exam  Patient follow-up all in all doing well no complaints takes occasional Prilosec without problems. Has been to dermatology and had normal skin check except for lesion under her right chin area which wants removed but is not cancerous and was left alone. Also has had ear check which is normal.  Hearing aids intact and in place without problems.  Relevant past medical, surgical, family and social history reviewed and updated as indicated. Interim medical history since our last visit reviewed. Allergies and medications reviewed and updated.  Review of Systems  Constitutional: Negative.   HENT: Negative.   Eyes: Negative.   Respiratory: Negative.   Cardiovascular: Negative.   Gastrointestinal: Negative.   Endocrine: Negative.   Genitourinary: Negative.   Musculoskeletal: Negative.   Skin: Negative.   Allergic/Immunologic: Negative.   Neurological: Negative.   Hematological: Negative.   Psychiatric/Behavioral: Negative.     Per HPI unless specifically indicated above     Objective:    BP 136/85   Pulse 77   Ht 5' 4.25" (1.632 m)   Wt 146 lb (66.2 kg)   SpO2 98%   BMI 24.87 kg/m   Wt Readings from Last 3 Encounters:  06/08/17 146 lb (66.2 kg)  06/09/16 147 lb (66.7 kg)  06/04/15 144 lb (65.3 kg)    Physical Exam  Constitutional: She is oriented to person, place, and time. She appears well-developed and well-nourished.  HENT:  Head: Normocephalic and atraumatic.  Right Ear: External ear normal.  Left Ear: External ear normal.  Nose: Nose normal.  Mouth/Throat: Oropharynx is clear and moist.  Eyes: Pupils are equal, round, and reactive to light. Conjunctivae and EOM  are normal.  Neck: Normal range of motion. Neck supple. Carotid bruit is not present.  Cardiovascular: Normal rate, regular rhythm and normal heart sounds.  No murmur heard. Pulmonary/Chest: Effort normal and breath sounds normal. She exhibits no mass. Right breast exhibits no mass, no skin change and no tenderness. Left breast exhibits no mass, no skin change and no tenderness. Breasts are symmetrical.  Abdominal: Soft. Bowel sounds are normal. There is no hepatosplenomegaly.  Musculoskeletal: Normal range of motion.  Neurological: She is alert and oriented to person, place, and time.  Skin: Skin is warm and dry. No rash noted.  Psychiatric: She has a normal mood and affect. Her behavior is normal. Judgment and thought content normal.    Results for orders placed or performed in visit on 08/04/16  Cologuard  Result Value Ref Range   Cologuard Negative       Assessment & Plan:   Problem List Items Addressed This Visit      Other   Decreased hearing of both ears    Doing well good reports       Other Visit Diagnoses    Routine general medical examination at a health care facility    -  Primary   Relevant Orders   CBC with Differential/Platelet   Comprehensive metabolic panel   Lipid panel   TSH   Urinalysis, Routine w reflex microscopic   Hepatitis  C Antibody   HIV antibody   Screening cholesterol level       Relevant Orders   Lipid panel   Thyroid disorder screen       Relevant Orders   TSH   Encounter for screening for HIV       Relevant Orders   HIV antibody   Need for hepatitis C screening test       Relevant Orders   Hepatitis C Antibody   Screening for diabetes mellitus (DM)       Relevant Orders   CBC with Differential/Platelet   Comprehensive metabolic panel   Urinalysis, Routine w reflex microscopic       Follow up plan: Return in about 1 year (around 06/09/2018) for Physical Exam.

## 2017-06-09 ENCOUNTER — Encounter: Payer: Self-pay | Admitting: Family Medicine

## 2017-06-09 LAB — LIPID PANEL
CHOLESTEROL TOTAL: 152 mg/dL (ref 100–199)
Chol/HDL Ratio: 3.2 ratio (ref 0.0–4.4)
HDL: 48 mg/dL (ref >39)
LDL CALC: 91 mg/dL (ref 0–99)
TRIGLYCERIDES: 67 mg/dL (ref 0–149)
VLDL Cholesterol Cal: 13 mg/dL (ref 5–40)

## 2017-06-09 LAB — COMPREHENSIVE METABOLIC PANEL
A/G RATIO: 2.1 (ref 1.2–2.2)
ALBUMIN: 4.5 g/dL (ref 3.6–4.8)
ALK PHOS: 95 IU/L (ref 39–117)
ALT: 13 IU/L (ref 0–32)
AST: 18 IU/L (ref 0–40)
BILIRUBIN TOTAL: 0.6 mg/dL (ref 0.0–1.2)
BUN / CREAT RATIO: 23 (ref 12–28)
BUN: 18 mg/dL (ref 8–27)
CHLORIDE: 107 mmol/L — AB (ref 96–106)
CO2: 23 mmol/L (ref 20–29)
Calcium: 9.2 mg/dL (ref 8.7–10.3)
Creatinine, Ser: 0.78 mg/dL (ref 0.57–1.00)
GFR calc Af Amer: 93 mL/min/{1.73_m2} (ref >59)
GFR calc non Af Amer: 81 mL/min/{1.73_m2} (ref >59)
GLOBULIN, TOTAL: 2.1 g/dL (ref 1.5–4.5)
Glucose: 95 mg/dL (ref 65–99)
POTASSIUM: 4.1 mmol/L (ref 3.5–5.2)
SODIUM: 146 mmol/L — AB (ref 134–144)
Total Protein: 6.6 g/dL (ref 6.0–8.5)

## 2017-06-09 LAB — CBC WITH DIFFERENTIAL/PLATELET
BASOS ABS: 0 10*3/uL (ref 0.0–0.2)
Basos: 1 %
EOS (ABSOLUTE): 0.3 10*3/uL (ref 0.0–0.4)
EOS: 6 %
HEMATOCRIT: 39.5 % (ref 34.0–46.6)
HEMOGLOBIN: 13.3 g/dL (ref 11.1–15.9)
Immature Grans (Abs): 0 10*3/uL (ref 0.0–0.1)
Immature Granulocytes: 0 %
LYMPHS ABS: 1.7 10*3/uL (ref 0.7–3.1)
Lymphs: 41 %
MCH: 29.4 pg (ref 26.6–33.0)
MCHC: 33.7 g/dL (ref 31.5–35.7)
MCV: 87 fL (ref 79–97)
MONOCYTES: 7 %
MONOS ABS: 0.3 10*3/uL (ref 0.1–0.9)
NEUTROS ABS: 1.9 10*3/uL (ref 1.4–7.0)
Neutrophils: 45 %
Platelets: 237 10*3/uL (ref 150–379)
RBC: 4.52 x10E6/uL (ref 3.77–5.28)
RDW: 13.7 % (ref 12.3–15.4)
WBC: 4.1 10*3/uL (ref 3.4–10.8)

## 2017-06-09 LAB — HIV ANTIBODY (ROUTINE TESTING W REFLEX): HIV SCREEN 4TH GENERATION: NONREACTIVE

## 2017-06-09 LAB — TSH: TSH: 2.6 u[IU]/mL (ref 0.450–4.500)

## 2017-06-09 LAB — HEPATITIS C ANTIBODY

## 2017-06-26 DIAGNOSIS — H10022 Other mucopurulent conjunctivitis, left eye: Secondary | ICD-10-CM | POA: Diagnosis not present

## 2017-06-29 ENCOUNTER — Ambulatory Visit
Admission: RE | Admit: 2017-06-29 | Discharge: 2017-06-29 | Disposition: A | Discharge status: Home / Self Care | Payer: PPO | Source: Ambulatory Visit | Attending: Family Medicine | Admitting: Family Medicine

## 2017-06-29 DIAGNOSIS — Z1231 Encounter for screening mammogram for malignant neoplasm of breast: Secondary | ICD-10-CM | POA: Diagnosis not present

## 2017-06-29 DIAGNOSIS — Z1239 Encounter for other screening for malignant neoplasm of breast: Secondary | ICD-10-CM

## 2017-06-29 DIAGNOSIS — R928 Other abnormal and inconclusive findings on diagnostic imaging of breast: Secondary | ICD-10-CM | POA: Insufficient documentation

## 2017-06-30 ENCOUNTER — Other Ambulatory Visit: Payer: Self-pay | Admitting: Family Medicine

## 2017-06-30 DIAGNOSIS — N6320 Unspecified lump in the left breast, unspecified quadrant: Secondary | ICD-10-CM

## 2017-06-30 DIAGNOSIS — R928 Other abnormal and inconclusive findings on diagnostic imaging of breast: Secondary | ICD-10-CM

## 2017-07-06 DIAGNOSIS — H6123 Impacted cerumen, bilateral: Secondary | ICD-10-CM | POA: Diagnosis not present

## 2017-07-06 DIAGNOSIS — H903 Sensorineural hearing loss, bilateral: Secondary | ICD-10-CM | POA: Diagnosis not present

## 2017-07-20 ENCOUNTER — Ambulatory Visit: Payer: PPO

## 2017-07-20 ENCOUNTER — Ambulatory Visit
Admission: RE | Admit: 2017-07-20 | Discharge: 2017-07-20 | Disposition: A | Discharge status: Home / Self Care | Payer: PPO | Source: Ambulatory Visit | Attending: Family Medicine | Admitting: Family Medicine

## 2017-07-20 ENCOUNTER — Other Ambulatory Visit: Payer: PPO

## 2017-07-20 DIAGNOSIS — R928 Other abnormal and inconclusive findings on diagnostic imaging of breast: Secondary | ICD-10-CM

## 2017-07-20 DIAGNOSIS — M13841 Other specified arthritis, right hand: Secondary | ICD-10-CM | POA: Diagnosis not present

## 2017-07-20 DIAGNOSIS — N6012 Diffuse cystic mastopathy of left breast: Secondary | ICD-10-CM | POA: Diagnosis not present

## 2017-07-20 DIAGNOSIS — R921 Mammographic calcification found on diagnostic imaging of breast: Secondary | ICD-10-CM | POA: Diagnosis not present

## 2017-07-20 DIAGNOSIS — N6320 Unspecified lump in the left breast, unspecified quadrant: Secondary | ICD-10-CM

## 2017-07-20 DIAGNOSIS — M1812 Unilateral primary osteoarthritis of first carpometacarpal joint, left hand: Secondary | ICD-10-CM | POA: Diagnosis not present

## 2017-10-12 DIAGNOSIS — H6123 Impacted cerumen, bilateral: Secondary | ICD-10-CM | POA: Diagnosis not present

## 2017-10-12 DIAGNOSIS — H903 Sensorineural hearing loss, bilateral: Secondary | ICD-10-CM | POA: Diagnosis not present

## 2017-11-27 DIAGNOSIS — B373 Candidiasis of vulva and vagina: Secondary | ICD-10-CM | POA: Diagnosis not present

## 2017-11-27 DIAGNOSIS — N390 Urinary tract infection, site not specified: Secondary | ICD-10-CM | POA: Diagnosis not present

## 2017-11-27 DIAGNOSIS — R399 Unspecified symptoms and signs involving the genitourinary system: Secondary | ICD-10-CM | POA: Diagnosis not present

## 2017-11-27 DIAGNOSIS — A499 Bacterial infection, unspecified: Secondary | ICD-10-CM | POA: Diagnosis not present

## 2017-12-07 ENCOUNTER — Other Ambulatory Visit: Payer: Self-pay | Admitting: Family Medicine

## 2017-12-07 ENCOUNTER — Telehealth: Payer: Self-pay | Admitting: Family Medicine

## 2017-12-07 DIAGNOSIS — R928 Other abnormal and inconclusive findings on diagnostic imaging of breast: Secondary | ICD-10-CM

## 2017-12-07 DIAGNOSIS — N63 Unspecified lump in unspecified breast: Secondary | ICD-10-CM

## 2017-12-07 NOTE — Telephone Encounter (Signed)
Patient notified that it is ok to call and schedule.

## 2017-12-07 NOTE — Telephone Encounter (Signed)
Copied from CRM [id number]. Topic: Quick Communication - See Telephone Encounter >> Dec 07, 2017  9:53 AM Herby Abraham C wrote: CRM for notification. See Telephone encounter for: 12/07/17.  Pt says that she received a letter to have a follow up for her mammogram that was on 07/20/17, pt says that location stated that PCP has to set up for her in order to schedule apt, please assist.   CB: [phone]

## 2017-12-07 NOTE — Telephone Encounter (Signed)
Okay to schedule

## 2018-01-11 DIAGNOSIS — H6123 Impacted cerumen, bilateral: Secondary | ICD-10-CM | POA: Diagnosis not present

## 2018-01-11 DIAGNOSIS — H903 Sensorineural hearing loss, bilateral: Secondary | ICD-10-CM | POA: Diagnosis not present

## 2018-01-25 ENCOUNTER — Ambulatory Visit
Admission: RE | Admit: 2018-01-25 | Discharge: 2018-01-25 | Disposition: A | Discharge status: Home / Self Care | Payer: PPO | Source: Ambulatory Visit | Attending: Family Medicine | Admitting: Family Medicine

## 2018-01-25 DIAGNOSIS — N63 Unspecified lump in unspecified breast: Secondary | ICD-10-CM

## 2018-01-25 DIAGNOSIS — R928 Other abnormal and inconclusive findings on diagnostic imaging of breast: Secondary | ICD-10-CM | POA: Diagnosis not present

## 2018-02-12 ENCOUNTER — Encounter: Payer: Self-pay | Admitting: Family Medicine

## 2018-03-12 ENCOUNTER — Other Ambulatory Visit: Payer: Self-pay | Admitting: Family Medicine

## 2018-03-12 NOTE — Telephone Encounter (Signed)
Filled until appointment in 5 months Requested Prescriptions  Pending Prescriptions Disp Refills  . omeprazole (PRILOSEC) 20 MG capsule [Pharmacy Med Name: OMEPRAZOLE DR 20 MG CAP] 90 capsule 1    Sig: TAKE 1 CAPSULE BY MOUTH EVERY DAY FOR ACID REFLUX     Gastroenterology: Proton Pump Inhibitors Failed - 03/12/2018  2:48 PM      Failed - Valid encounter within last 12 months    Recent Outpatient Visits          9 months ago Routine general medical examination at a health care facility   [facility] Dossie Arbour, Redge Gainer, MD   1 year ago Routine general medical examination at a health care facility   [facility] Dossie Arbour, Redge Gainer, MD   2 years ago Routine general medical examination at a health care facility   [facility] Crissman, Redge Gainer, MD      Future Appointments            In 5 months Crissman, Redge Gainer, MD [facility]

## 2018-04-12 DIAGNOSIS — H9209 Otalgia, unspecified ear: Secondary | ICD-10-CM | POA: Diagnosis not present

## 2018-04-12 DIAGNOSIS — H903 Sensorineural hearing loss, bilateral: Secondary | ICD-10-CM | POA: Diagnosis not present

## 2018-04-12 DIAGNOSIS — H6123 Impacted cerumen, bilateral: Secondary | ICD-10-CM | POA: Diagnosis not present

## 2018-06-14 ENCOUNTER — Encounter: Payer: Managed Care, Other (non HMO) | Admitting: Family Medicine

## 2018-07-19 DIAGNOSIS — H6123 Impacted cerumen, bilateral: Secondary | ICD-10-CM | POA: Diagnosis not present

## 2018-07-19 DIAGNOSIS — H903 Sensorineural hearing loss, bilateral: Secondary | ICD-10-CM | POA: Diagnosis not present

## 2018-07-22 ENCOUNTER — Encounter: Payer: Managed Care, Other (non HMO) | Admitting: Family Medicine

## 2018-08-17 ENCOUNTER — Ambulatory Visit (INDEPENDENT_AMBULATORY_CARE_PROVIDER_SITE_OTHER): Payer: PPO | Admitting: Family Medicine

## 2018-08-17 ENCOUNTER — Encounter: Payer: Self-pay | Admitting: Family Medicine

## 2018-08-17 ENCOUNTER — Other Ambulatory Visit: Payer: Self-pay

## 2018-08-17 VITALS — BP 125/70 | Wt 132.0 lb

## 2018-08-17 DIAGNOSIS — H9193 Unspecified hearing loss, bilateral: Secondary | ICD-10-CM

## 2018-08-17 DIAGNOSIS — M19042 Primary osteoarthritis, left hand: Secondary | ICD-10-CM

## 2018-08-17 DIAGNOSIS — Z0000 Encounter for general adult medical examination without abnormal findings: Secondary | ICD-10-CM | POA: Diagnosis not present

## 2018-08-17 DIAGNOSIS — M19041 Primary osteoarthritis, right hand: Secondary | ICD-10-CM | POA: Diagnosis not present

## 2018-08-17 DIAGNOSIS — K219 Gastro-esophageal reflux disease without esophagitis: Secondary | ICD-10-CM | POA: Diagnosis not present

## 2018-08-17 MED ORDER — OMEPRAZOLE 20 MG PO CPDR: 20.0000 mg | DELAYED_RELEASE_CAPSULE | Freq: Every day | ORAL | 4 refills | Status: DC

## 2018-08-17 NOTE — Assessment & Plan Note (Signed)
The current medical regimen is effective;  continue present plan and medications.  

## 2018-08-17 NOTE — Assessment & Plan Note (Signed)
stable °

## 2018-08-17 NOTE — Progress Notes (Signed)
BP 125/70   Wt 132 lb (59.9 kg)   BMI 22.48 kg/m    Subjective:    Patient ID: Christina Webb, female    DOB: [birth year], 68 y.o.   MRN: [id number]  HPI: Christina Webb is a 68 y.o. female  Med check  Discussed with patient all in all doing well no complaints hearing is stable with hearing aids. Reflux stable taking as needed Prilosec. Osteoarthritis in hands are stable using vitamin E.   Relevant past medical, surgical, family and social history reviewed and updated as indicated. Interim medical history since our last visit reviewed. Allergies and medications reviewed and updated.  Review of Systems  Constitutional: Negative.   HENT: Negative.   Eyes: Negative.   Respiratory: Negative.   Cardiovascular: Negative.   Gastrointestinal: Negative.   Endocrine: Negative.   Genitourinary: Negative.   Musculoskeletal: Negative.   Skin: Negative.   Allergic/Immunologic: Negative.   Neurological: Negative.   Hematological: Negative.   Psychiatric/Behavioral: Negative.     Per HPI unless specifically indicated above     Objective:    BP 125/70   Wt 132 lb (59.9 kg)   BMI 22.48 kg/m   Wt Readings from Last 3 Encounters:  08/17/18 132 lb (59.9 kg)  06/08/17 146 lb (66.2 kg)  06/09/16 147 lb (66.7 kg)    Physical Exam  Results for orders placed or performed in visit on 06/08/17  Microscopic Examination   URINE  Result Value Ref Range   WBC, UA 0-5 0 - 5 /hpf   RBC, UA 0-2 0 - 2 /hpf   Epithelial Cells (non renal) 0-10 0 - 10 /hpf   Bacteria, UA None seen None seen/Few  CBC with Differential/Platelet  Result Value Ref Range   WBC 4.1 3.4 - 10.8 x10E3/uL   RBC 4.52 3.77 - 5.28 x10E6/uL   Hemoglobin 13.3 11.1 - 15.9 g/dL   Hematocrit 39.5 34.0 - 46.6 %   MCV 87 79 - 97 fL   MCH 29.4 26.6 - 33.0 pg   MCHC 33.7 31.5 - 35.7 g/dL   RDW 13.7 12.3 - 15.4 %   Platelets 237 150 - 379 x10E3/uL   Neutrophils 45 Not Estab. %   Lymphs 41 Not Estab. %   Monocytes 7 Not Estab. %   Eos 6 Not Estab. %   Basos 1 Not Estab. %   Neutrophils Absolute 1.9 1.4 - 7.0 x10E3/uL   Lymphocytes Absolute 1.7 0.7 - 3.1 x10E3/uL   Monocytes Absolute 0.3 0.1 - 0.9 x10E3/uL   EOS (ABSOLUTE) 0.3 0.0 - 0.4 x10E3/uL   Basophils Absolute 0.0 0.0 - 0.2 x10E3/uL   Immature Granulocytes 0 Not Estab. %   Immature Grans (Abs) 0.0 0.0 - 0.1 x10E3/uL  Comprehensive metabolic panel  Result Value Ref Range   Glucose 95 65 - 99 mg/dL   BUN 18 8 - 27 mg/dL   Creatinine, Ser 0.78 0.57 - 1.00 mg/dL   GFR calc non Af Amer 81 >59 mL/min/1.73   GFR calc Af Amer 93 >59 mL/min/1.73   BUN/Creatinine Ratio 23 12 - 28   Sodium 146 (H) 134 - 144 mmol/L   Potassium 4.1 3.5 - 5.2 mmol/L   Chloride 107 (H) 96 - 106 mmol/L   CO2 23 20 - 29 mmol/L   Calcium 9.2 8.7 - 10.3 mg/dL   Total Protein 6.6 6.0 - 8.5 g/dL   Albumin 4.5 3.6 - 4.8 g/dL   Globulin, Total 2.1 1.5 - 4.5  g/dL   Albumin/Globulin Ratio 2.1 1.2 - 2.2   Bilirubin Total 0.6 0.0 - 1.2 mg/dL   Alkaline Phosphatase 95 39 - 117 IU/L   AST 18 0 - 40 IU/L   ALT 13 0 - 32 IU/L  Lipid panel  Result Value Ref Range   Cholesterol, Total 152 100 - 199 mg/dL   Triglycerides 67 0 - 149 mg/dL   HDL 48 >16>39 mg/dL   VLDL Cholesterol Cal 13 5 - 40 mg/dL   LDL Calculated 91 0 - 99 mg/dL   Chol/HDL Ratio 3.2 0.0 - 4.4 ratio  TSH  Result Value Ref Range   TSH 2.600 0.450 - 4.500 uIU/mL  Urinalysis, Routine w reflex microscopic  Result Value Ref Range   Specific Gravity, UA 1.020 1.005 - 1.030   pH, UA 6.5 5.0 - 7.5   Color, UA Yellow Yellow   Appearance Ur Hazy (A) Clear   Leukocytes, UA Trace (A) Negative   Protein, UA Trace (A) Negative/Trace   Glucose, UA Negative Negative   Ketones, UA Trace (A) Negative   RBC, UA Negative Negative   Bilirubin, UA Negative Negative   Urobilinogen, Ur 1.0 0.2 - 1.0 mg/dL   Nitrite, UA Negative Negative   Microscopic Examination See below:   Hepatitis C Antibody  Result Value Ref  Range   Hep C Virus Ab <0.1 0.0 - 0.9 s/co ratio  HIV antibody  Result Value Ref Range   HIV Screen 4th Generation wRfx Non Reactive Non Reactive      Assessment & Plan:   Problem List Items Addressed This Visit      Digestive   GERD (gastroesophageal reflux disease)    The current medical regimen is effective;  continue present plan and medications.       Relevant Medications   omeprazole (PRILOSEC) 20 MG capsule   Other Relevant Orders   Comprehensive metabolic panel   Lipid panel   CBC with Differential/Platelet   TSH   Urinalysis, Routine w reflex microscopic     Musculoskeletal and Integument   Osteoarthritis of both hands    stable      Relevant Orders   Comprehensive metabolic panel   Lipid panel   CBC with Differential/Platelet   TSH   Urinalysis, Routine w reflex microscopic     Other   Decreased hearing of both ears    Stable        Other Visit Diagnoses    PE (physical exam), annual    -  Primary   Relevant Orders   Comprehensive metabolic panel   Lipid panel   CBC with Differential/Platelet   TSH   Urinalysis, Routine w reflex microscopic       Follow up plan: Return in about 1 year (around 08/17/2019) for Physical Exam.

## 2018-08-17 NOTE — Assessment & Plan Note (Signed)
Stable

## 2018-08-23 ENCOUNTER — Other Ambulatory Visit: Payer: PPO

## 2018-08-23 ENCOUNTER — Other Ambulatory Visit: Payer: Self-pay

## 2018-08-23 DIAGNOSIS — M19042 Primary osteoarthritis, left hand: Secondary | ICD-10-CM

## 2018-08-23 DIAGNOSIS — K219 Gastro-esophageal reflux disease without esophagitis: Secondary | ICD-10-CM | POA: Diagnosis not present

## 2018-08-23 DIAGNOSIS — M19041 Primary osteoarthritis, right hand: Secondary | ICD-10-CM | POA: Diagnosis not present

## 2018-08-23 DIAGNOSIS — Z0000 Encounter for general adult medical examination without abnormal findings: Secondary | ICD-10-CM | POA: Diagnosis not present

## 2018-08-23 LAB — URINALYSIS, ROUTINE W REFLEX MICROSCOPIC
Bilirubin, UA: NEGATIVE
Glucose, UA: NEGATIVE
Ketones, UA: NEGATIVE
Nitrite, UA: NEGATIVE
Protein,UA: NEGATIVE
Specific Gravity, UA: 1.025 (ref 1.005–1.030)
Urobilinogen, Ur: 0.2 mg/dL (ref 0.2–1.0)
pH, UA: 6 (ref 5.0–7.5)

## 2018-08-23 LAB — MICROSCOPIC EXAMINATION

## 2018-08-24 LAB — COMPREHENSIVE METABOLIC PANEL
ALT: 10 IU/L (ref 0–32)
AST: 17 IU/L (ref 0–40)
Albumin/Globulin Ratio: 2.1 (ref 1.2–2.2)
Albumin: 4.5 g/dL (ref 3.8–4.8)
Alkaline Phosphatase: 87 IU/L (ref 39–117)
BUN/Creatinine Ratio: 30 — ABNORMAL HIGH (ref 12–28)
BUN: 21 mg/dL (ref 8–27)
Bilirubin Total: 0.5 mg/dL (ref 0.0–1.2)
CO2: 23 mmol/L (ref 20–29)
Calcium: 8.9 mg/dL (ref 8.7–10.3)
Chloride: 104 mmol/L (ref 96–106)
Creatinine, Ser: 0.71 mg/dL (ref 0.57–1.00)
GFR calc Af Amer: 103 mL/min/{1.73_m2} (ref >59)
GFR calc non Af Amer: 89 mL/min/{1.73_m2} (ref >59)
Globulin, Total: 2.1 g/dL (ref 1.5–4.5)
Glucose: 93 mg/dL (ref 65–99)
Potassium: 4 mmol/L (ref 3.5–5.2)
Sodium: 141 mmol/L (ref 134–144)
Total Protein: 6.6 g/dL (ref 6.0–8.5)

## 2018-08-24 LAB — LIPID PANEL
Chol/HDL Ratio: 3.2 ratio (ref 0.0–4.4)
Cholesterol, Total: 163 mg/dL (ref 100–199)
HDL: 51 mg/dL (ref >39)
LDL Calculated: 98 mg/dL (ref 0–99)
Triglycerides: 70 mg/dL (ref 0–149)
VLDL Cholesterol Cal: 14 mg/dL (ref 5–40)

## 2018-08-24 LAB — CBC WITH DIFFERENTIAL/PLATELET
Basophils Absolute: 0 10*3/uL (ref 0.0–0.2)
Basos: 1 %
EOS (ABSOLUTE): 0.3 10*3/uL (ref 0.0–0.4)
Eos: 7 %
Hematocrit: 38.6 % (ref 34.0–46.6)
Hemoglobin: 12.8 g/dL (ref 11.1–15.9)
Immature Grans (Abs): 0 10*3/uL (ref 0.0–0.1)
Immature Granulocytes: 0 %
Lymphocytes Absolute: 1.8 10*3/uL (ref 0.7–3.1)
Lymphs: 42 %
MCH: 29.8 pg (ref 26.6–33.0)
MCHC: 33.2 g/dL (ref 31.5–35.7)
MCV: 90 fL (ref 79–97)
Monocytes Absolute: 0.3 10*3/uL (ref 0.1–0.9)
Monocytes: 8 %
Neutrophils Absolute: 1.8 10*3/uL (ref 1.4–7.0)
Neutrophils: 42 %
Platelets: 218 10*3/uL (ref 150–450)
RBC: 4.3 x10E6/uL (ref 3.77–5.28)
RDW: 12.6 % (ref 11.7–15.4)
WBC: 4.2 10*3/uL (ref 3.4–10.8)

## 2018-08-24 LAB — TSH: TSH: 3.74 u[IU]/mL (ref 0.450–4.500)

## 2018-09-17 ENCOUNTER — Other Ambulatory Visit: Payer: Self-pay

## 2018-10-18 ENCOUNTER — Telehealth: Payer: Self-pay | Admitting: Family Medicine

## 2018-10-18 DIAGNOSIS — H903 Sensorineural hearing loss, bilateral: Secondary | ICD-10-CM | POA: Diagnosis not present

## 2018-10-18 DIAGNOSIS — H6123 Impacted cerumen, bilateral: Secondary | ICD-10-CM | POA: Diagnosis not present

## 2018-10-18 MED ORDER — BETAMETHASONE DIPROPIONATE 0.05 % EX CREA: TOPICAL_CREAM | Freq: Two times a day (BID) | CUTANEOUS | 0 refills | Status: AC

## 2018-10-18 NOTE — Telephone Encounter (Signed)
Betamethasone Dipropionate  0.05% topical ointment, rash on hands from washing them so much, gets a small rash, uses ointment over night and it goes away.

## 2018-10-18 NOTE — Telephone Encounter (Signed)
Medication Refill - Medication: Dermatology Rx (see below)  Pt is requesting a call back re: request for rx originally prescribed to her by dermatologist for rash on hands that has returned.  Please call pt to discuss: [phone] Preferred Pharmacy:  [place], [place] - [address] [phone] (Phone) [phone] (Fax)

## 2018-12-04 IMAGING — US ULTRASOUND LEFT BREAST LIMITED
1 series · 8 of 8 positions shown · non-contrast
Comparison: Previous exam(s).

CLINICAL DATA: Left breast possible mass.

EXAM:
DIGITAL DIAGNOSTIC LEFT MAMMOGRAM WITH CAD AND TOMO
ULTRASOUND LEFT BREAST

[Series 1: ultrasound left breast limited · 0.04mm/px · 8 of 8 slices shown]
[im 1/8]
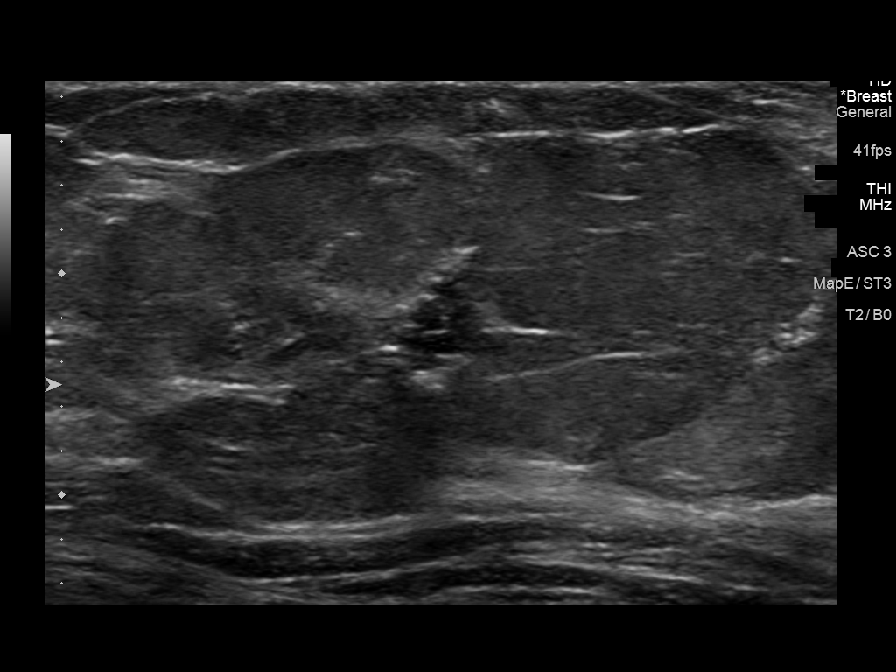
[im 2/8]
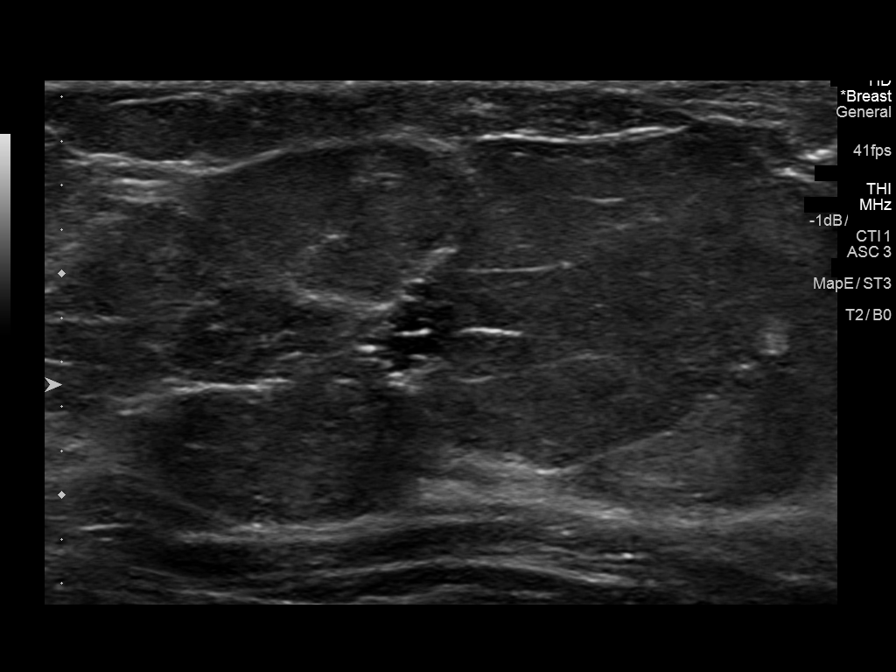
[im 3/8]
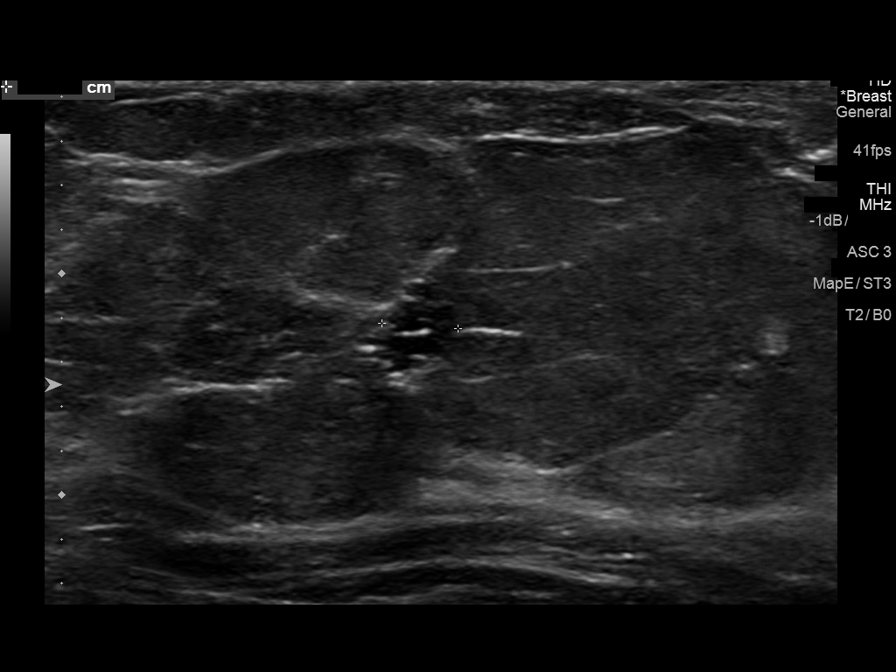
[im 4/8]
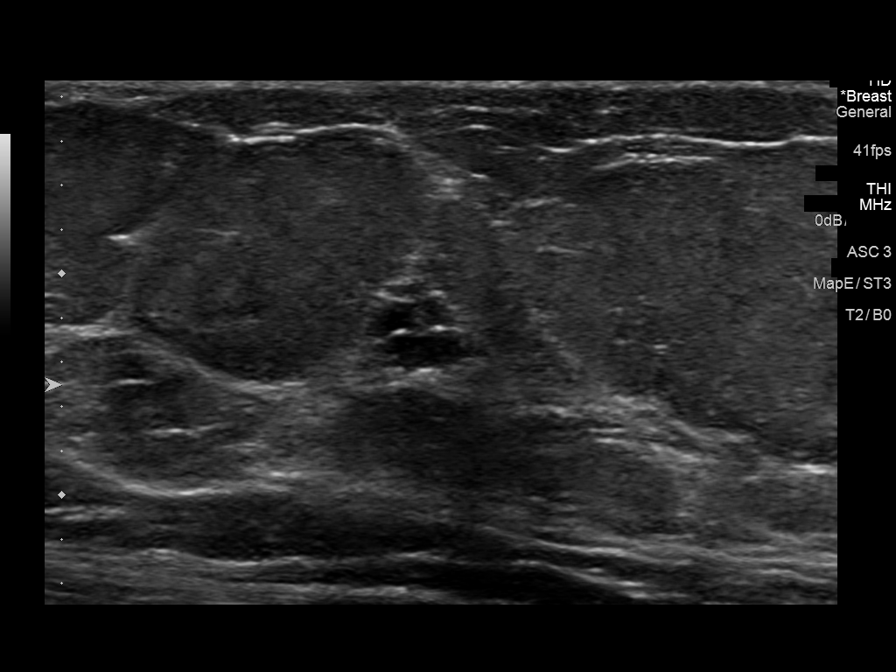
[im 5/8]
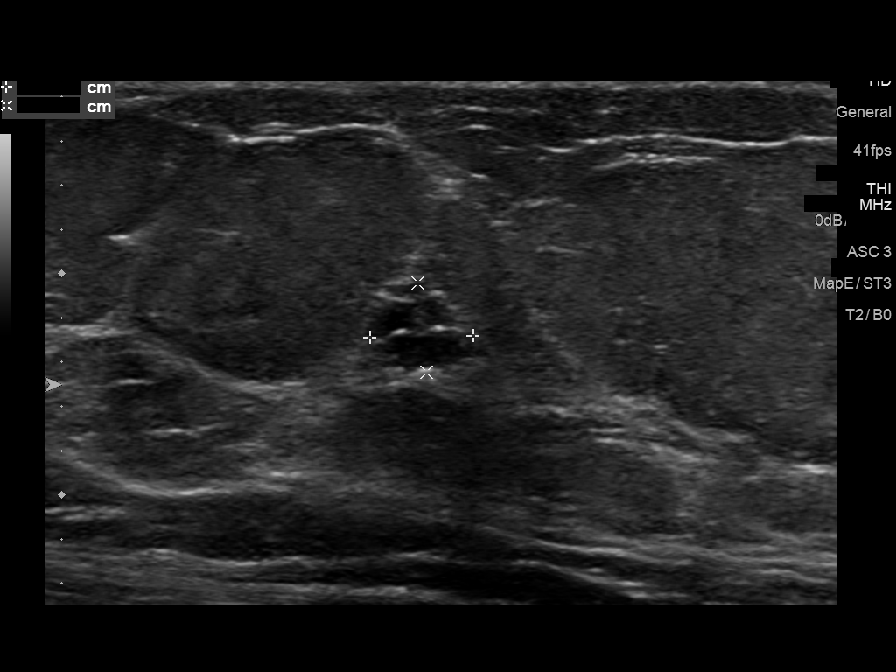
[im 6/8]
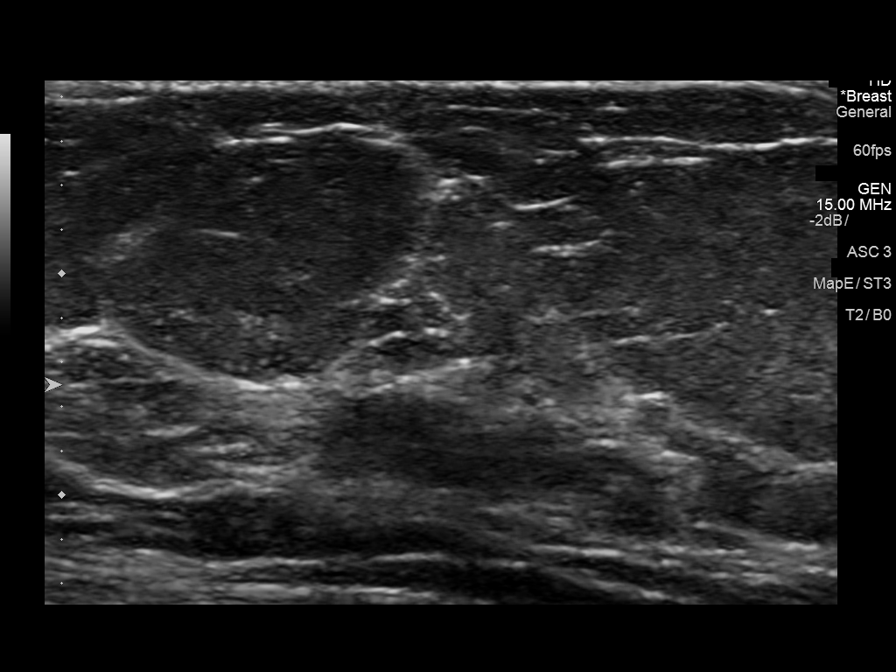
[im 7/8]
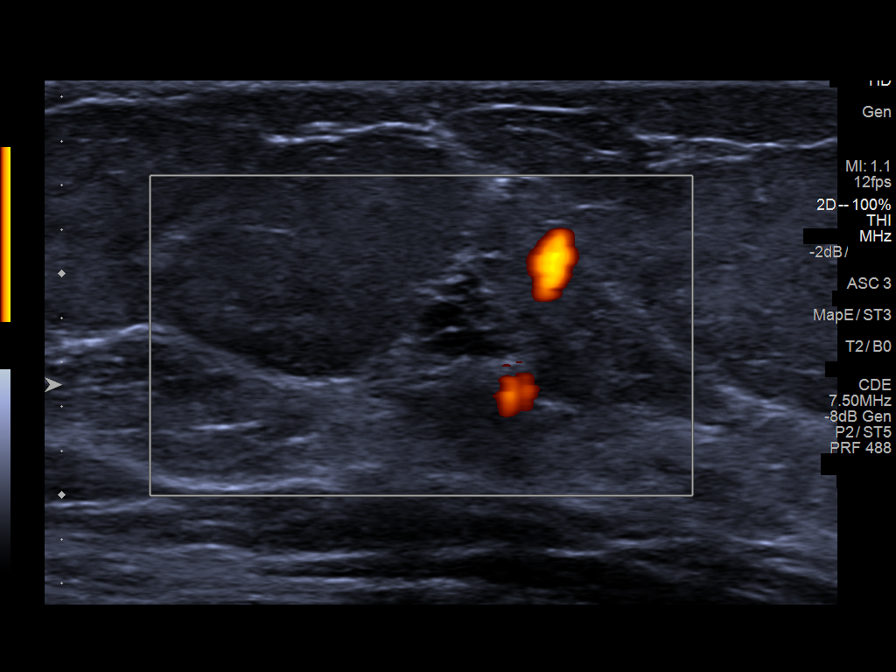
[im 8/8]
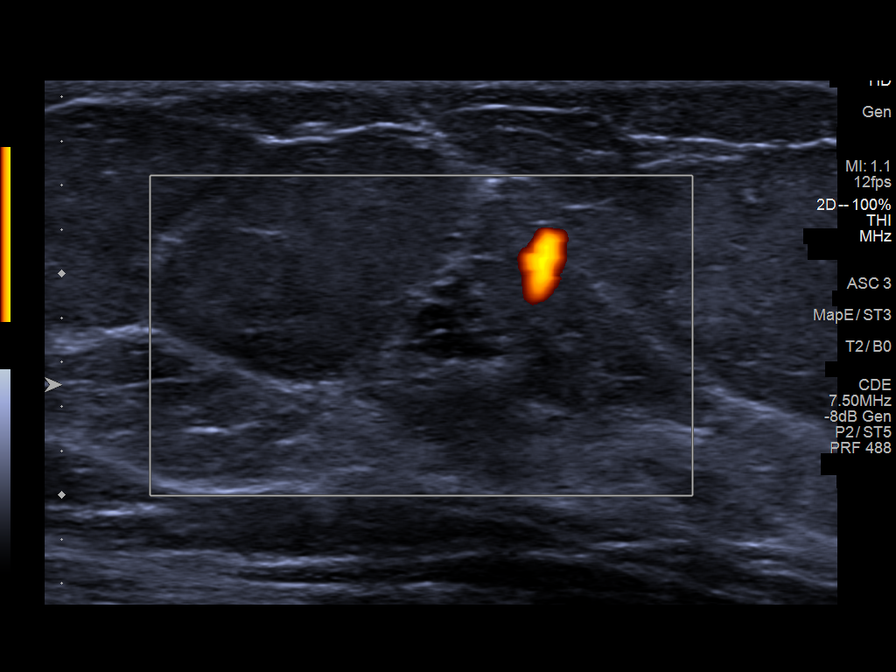

[8 of 8 positions shown; findings below may reference images not displayed]

ACR Breast Density Category b: There are scattered areas of
fibroglandular density.
FINDINGS: Additional mammographic views of the left breast demonstrate
persistent circumscribed 5 mm nodule in the left breast upper inner
quadrant, middle depth. Peripheral wall calcifications are seen,
usually associated with a benign cyst.

Mammographic images were processed with CAD.

On physical exam, no suspicious masses are palpated.

Targeted ultrasound is performed, showing no suspicious masses or
shadowing lesions. In the left breast 11 o'clock 3 cm from the
nipple there is a cluster of benign-appearing cysts which measures
0.5 x 0.4 x 0.3 cm and likely corresponds to the mammographically
seen nodule.
IMPRESSION: Left breast upper inner quadrant probably benign nodule, for which
short-term follow-up is recommended.

RECOMMENDATION:
Diagnostic mammogram and possibly ultrasound of the left breast in 6
months. (Code:FU-Y-17U)

I have discussed the findings and recommendations with the patient.
Results were also provided in writing at the conclusion of the
visit. If applicable, a reminder letter will be sent to the patient
regarding the next appointment.

BI-RADS CATEGORY  3: Probably benign.

## 2018-12-20 ENCOUNTER — Telehealth: Payer: Self-pay | Admitting: Nurse Practitioner

## 2018-12-20 ENCOUNTER — Other Ambulatory Visit: Payer: Self-pay | Admitting: Nurse Practitioner

## 2018-12-20 DIAGNOSIS — Z1231 Encounter for screening mammogram for malignant neoplasm of breast: Secondary | ICD-10-CM

## 2018-12-20 NOTE — Telephone Encounter (Signed)
Patient notified

## 2018-12-20 NOTE — Telephone Encounter (Signed)
Copied from [place] [id number]. Topic: General - Other >> Dec 20, 2018  8:47 AM Antonieta Iba C wrote: Reason for CRM: pt called to request to have a order placed for her mammogram. Pt says that she was notified that it is time. Pt uses [facility] with [place].   CB: [phone]

## 2018-12-20 NOTE — Telephone Encounter (Signed)
Mammogram order placed

## 2019-01-11 ENCOUNTER — Telehealth: Payer: Self-pay

## 2019-01-11 NOTE — Telephone Encounter (Signed)
Patient notified to donate blood, but her husband works for [employer] so she will  come in to have it drawn at next visit.  Copied from [place] [id number]. Topic: General - Inquiry >> Jan 11, 2019  1:54 PM Sheran Luz wrote: Patient would like to know how she can find out her blood type. Advised patient that she could contact [facility] if she had ever donated blood, she states she has not. Patient also states that she cannot call the hospital to find out, as she has not been seen in ED. Please advise.

## 2019-01-17 DIAGNOSIS — H903 Sensorineural hearing loss, bilateral: Secondary | ICD-10-CM | POA: Diagnosis not present

## 2019-01-17 DIAGNOSIS — H6123 Impacted cerumen, bilateral: Secondary | ICD-10-CM | POA: Diagnosis not present

## 2019-01-28 ENCOUNTER — Ambulatory Visit
Admission: RE | Admit: 2019-01-28 | Discharge: 2019-01-28 | Disposition: A | Discharge status: Home / Self Care | Payer: PPO | Source: Ambulatory Visit | Attending: Nurse Practitioner | Admitting: Nurse Practitioner

## 2019-01-28 DIAGNOSIS — Z1231 Encounter for screening mammogram for malignant neoplasm of breast: Secondary | ICD-10-CM | POA: Insufficient documentation

## 2019-04-25 DIAGNOSIS — H903 Sensorineural hearing loss, bilateral: Secondary | ICD-10-CM | POA: Diagnosis not present

## 2019-04-25 DIAGNOSIS — H6123 Impacted cerumen, bilateral: Secondary | ICD-10-CM | POA: Diagnosis not present

## 2019-07-19 ENCOUNTER — Encounter: Payer: Self-pay | Admitting: Nurse Practitioner

## 2019-07-19 ENCOUNTER — Ambulatory Visit (INDEPENDENT_AMBULATORY_CARE_PROVIDER_SITE_OTHER): Payer: Medicare Other | Admitting: Nurse Practitioner

## 2019-07-19 ENCOUNTER — Other Ambulatory Visit: Payer: Self-pay

## 2019-07-19 VITALS — BP 131/75 | HR 77 | Temp 98.4°F | Ht 64.0 in | Wt 142.8 lb

## 2019-07-19 DIAGNOSIS — Z1212 Encounter for screening for malignant neoplasm of rectum: Secondary | ICD-10-CM

## 2019-07-19 DIAGNOSIS — M25511 Pain in right shoulder: Secondary | ICD-10-CM

## 2019-07-19 DIAGNOSIS — H9193 Unspecified hearing loss, bilateral: Secondary | ICD-10-CM

## 2019-07-19 DIAGNOSIS — J309 Allergic rhinitis, unspecified: Secondary | ICD-10-CM | POA: Insufficient documentation

## 2019-07-19 DIAGNOSIS — K219 Gastro-esophageal reflux disease without esophagitis: Secondary | ICD-10-CM | POA: Diagnosis not present

## 2019-07-19 DIAGNOSIS — R5383 Other fatigue: Secondary | ICD-10-CM | POA: Diagnosis not present

## 2019-07-19 DIAGNOSIS — J301 Allergic rhinitis due to pollen: Secondary | ICD-10-CM

## 2019-07-19 DIAGNOSIS — Z1211 Encounter for screening for malignant neoplasm of colon: Secondary | ICD-10-CM | POA: Diagnosis not present

## 2019-07-19 DIAGNOSIS — Z0000 Encounter for general adult medical examination without abnormal findings: Secondary | ICD-10-CM

## 2019-07-19 DIAGNOSIS — Z13820 Encounter for screening for osteoporosis: Secondary | ICD-10-CM

## 2019-07-19 DIAGNOSIS — Z136 Encounter for screening for cardiovascular disorders: Secondary | ICD-10-CM | POA: Diagnosis not present

## 2019-07-19 DIAGNOSIS — Z13220 Encounter for screening for lipoid disorders: Secondary | ICD-10-CM

## 2019-07-19 DIAGNOSIS — Z1329 Encounter for screening for other suspected endocrine disorder: Secondary | ICD-10-CM

## 2019-07-19 MED ORDER — FLUTICASONE PROPIONATE 50 MCG/ACT NA SUSP: 2.0000 | Freq: Every day | NASAL | 6 refills | Status: AC

## 2019-07-19 MED ORDER — OMEPRAZOLE 20 MG PO CPDR: 20.0000 mg | DELAYED_RELEASE_CAPSULE | Freq: Every day | ORAL | 3 refills | Status: AC | PRN

## 2019-07-19 NOTE — Assessment & Plan Note (Signed)
Chronic, ongoing.  Follows with Audiologist and ear doctor every 3 months.  Stable with hearing aids.

## 2019-07-19 NOTE — Assessment & Plan Note (Signed)
Chronic, ongoing.  Patient takes omeprazole as needed and knows triggers.  Reports previous workup with EGD and GI in past.

## 2019-07-19 NOTE — Progress Notes (Signed)
BP 131/75 (BP Location: Left Arm, Patient Position: Sitting, Cuff Size: Normal)   Pulse 77   Temp 98.4 F (36.9 C) (Oral)   Ht 5\' 4"  (1.626 m)   Wt 142 lb 12.8 oz (64.8 kg)   SpO2 97%   BMI 24.51 kg/m    Subjective:    Patient ID: Christina Webb, female    DOB: 1950/03/03, 69 y.o.   MRN: [id number]  HPI: Christina Webb is a 69 y.o. female presenting on 07/19/2019 for comprehensive medical examination. Current medical complaints include:  ALLERGIES Takes nasal spray and works great for morning nasal congestion Duration: chronic Runny nose: yes clear Nasal congestion: no Nasal itching: no Sneezing: yes Eye swelling, itching or discharge: no Post nasal drip: no Cough: no Sinus pressure: no  Ear pain: no  Ear pressure: no  Fever: no  Symptoms occur seasonally: yes Symptoms occur perenially: no Satisfied with current treatment: yes Allergist evaluation in past: yes; per patient testing was negative except for pollen Allergen injection immunotherapy: no Recurrent sinus infections: no ENT evaluation in past: no Known environmental allergy: no Indoor pets: no History of asthma: no Current allergy medications: fluticasone Treatments attempted: flucticasone  GERD Takes omeprazole 20 mg daily as needed for acid reflux.  Reports she does not take every day and just before eating a meal that has onions or peppers in it as these are her triggers. GERD control status: controlled  Satisfied with current treatment? yes Heartburn frequency: knows triggers Medication side effects: no  Medication compliance: excellent Previous GERD medications: omeprazole Antacid use frequency:  none Duration: minutes Nature: burning Location: epigastric Heartburn duration:  Alleviatiating factors:   Aggravating factors:  Dysphagia: no Odynophagia:  no Hematemesis: no Blood in stool: no EGD: yes in past  SHOULDER PAIN Duration: chronic Involved shoulder: right Mechanism of  injury: unknown Location: anterior Onset:gradual Severity: moderate  Quality:  ache Frequency: intermittent Radiation: no Aggravating factors: lifting, movement   Alleviating factors: rest, heat   Status: stable Treatments attempted: heat   Relief with NSAIDs?:  No NSAIDs Taken Weakness: no Numbness: no Decreased grip strength: no Redness: no Swelling: no Bruising: no Fevers: no  She currently lives with: husband, Shanon Brow Menopausal Symptoms: no  Depression Screen done today and results listed below:  Depression screen [facility] 2/9 07/19/2019 06/08/2017 06/09/2016 06/09/2016 06/04/2015  Decreased Interest 0 0 0 0 0  Down, Depressed, Hopeless 0 0 0 0 0  PHQ - 2 Score 0 0 0 0 0  Altered sleeping 0 - - - 0  Tired, decreased energy 0 - - - 0  Change in appetite 0 - - - 0  Feeling bad or failure about yourself  0 - - - 0  Trouble concentrating 0 - - - 0  Moving slowly or fidgety/restless 0 - - - 0  Suicidal thoughts 0 - - - 0  PHQ-9 Score 0 - - - 0  Difficult doing work/chores Not difficult at all - - - -   The patient does not have a history of falls. I did not complete a risk assessment for falls. A plan of care for falls was not documented.  Past Medical History:  Past Medical History:  Diagnosis Date  . GERD (gastroesophageal reflux disease)    Surgical History:  Past Surgical History:  Procedure Laterality Date  . ABDOMINAL HYSTERECTOMY    . CESAREAN SECTION    . EYE SURGERY Bilateral    cataract  . LAPAROSCOPIC OOPHERECTOMY    .  TYMPANOPLASTY  1983   Medications:  Current Outpatient Medications on File Prior to Visit  Medication Sig  . aspirin EC 81 MG tablet Take 81 mg by mouth at bedtime.  . betamethasone dipropionate (DIPROLENE) 0.05 % cream Apply topically 2 (two) times daily.   No current facility-administered medications on file prior to visit.   Allergies:  Allergies  Allergen Reactions  . Sulfa Antibiotics Nausea And Vomiting and Rash  . Sulfur Rash    Social History:  Social History   Socioeconomic History  . Marital status: Married    Spouse name: Not on file  . Number of children: Not on file  . Years of education: Not on file  . Highest education level: Not on file  Occupational History  . Not on file  Tobacco Use  . Smoking status: Never Smoker  . Smokeless tobacco: Never Used  Substance and Sexual Activity  . Alcohol use: No  . Drug use: No  . Sexual activity: Not on file  Other Topics Concern  . Not on file  Social History Narrative  . Not on file   Social Determinants of Health   Financial Resource Strain:   . Difficulty of Paying Living Expenses:   Food Insecurity:   . Worried About Programme researcher, broadcasting/film/video in the Last Year:   . Barista in the Last Year:   Transportation Needs:   . Freight forwarder (Medical):   Marland Kitchen Lack of Transportation (Non-Medical):   Physical Activity:   . Days of Exercise per Week:   . Minutes of Exercise per Session:   Stress:   . Feeling of Stress :   Social Connections:   . Frequency of Communication with Friends and Family:   . Frequency of Social Gatherings with Friends and Family:   . Attends Religious Services:   . Active Member of Clubs or Organizations:   . Attends Banker Meetings:   Marland Kitchen Marital Status:   Intimate Partner Violence:   . Fear of Current or Ex-Partner:   . Emotionally Abused:   Marland Kitchen Physically Abused:   . Sexually Abused:    Social History   Tobacco Use  Smoking Status Never Smoker  Smokeless Tobacco Never Used   Social History   Substance and Sexual Activity  Alcohol Use No   Family History:  Family History  Problem Relation Age of Onset  . Hyperlipidemia Mother   . Hypertension Mother   . COPD Father   . Breast cancer Paternal Aunt    Past medical history, surgical history, medications, allergies, family history and social history reviewed with patient today and changes made to appropriate areas of the chart.   Review  of Systems  Constitutional: Negative.  Negative for fever and malaise/fatigue.  HENT: Positive for hearing loss. Negative for congestion, sinus pain, sore throat and tinnitus.   Eyes: Negative.  Negative for blurred vision and double vision.  Respiratory: Negative.  Negative for cough, shortness of breath and wheezing.   Cardiovascular: Negative.  Negative for chest pain, palpitations and leg swelling.  Gastrointestinal: Negative.  Negative for blood in stool, constipation, diarrhea, heartburn, nausea and vomiting.  Genitourinary: Negative.  Negative for dysuria, hematuria and urgency.  Musculoskeletal: Positive for myalgias (right shoulder pain at night). Negative for back pain, joint pain and neck pain.  Skin: Negative.  Negative for itching and rash.  Neurological: Negative.  Negative for dizziness, weakness and headaches.  Psychiatric/Behavioral: Negative.  Negative for depression and memory  loss. The patient is not nervous/anxious.    All other ROS negative except what is listed above and in the HPI.      Objective:    BP 131/75 (BP Location: Left Arm, Patient Position: Sitting, Cuff Size: Normal)   Pulse 77   Temp 98.4 F (36.9 C) (Oral)   Ht 5\' 4"  (1.626 m)   Wt 142 lb 12.8 oz (64.8 kg)   SpO2 97%   BMI 24.51 kg/m   Wt Readings from Last 3 Encounters:  07/19/19 142 lb 12.8 oz (64.8 kg)  08/17/18 132 lb (59.9 kg)  06/08/17 146 lb (66.2 kg)    Physical Exam Vitals and nursing note reviewed.  Constitutional:      General: She is not in acute distress.    Appearance: Normal appearance. She is normal weight. She is not ill-appearing or toxic-appearing.  HENT:     Head: Normocephalic and atraumatic.     Right Ear: Tympanic membrane, ear canal and external ear normal.     Left Ear: Tympanic membrane, ear canal and external ear normal.     Nose: Nose normal. No congestion or rhinorrhea.     Mouth/Throat:     Mouth: Mucous membranes are moist.     Pharynx: Oropharynx is  clear. No oropharyngeal exudate.  Eyes:     General: No scleral icterus.    Extraocular Movements: Extraocular movements intact.     Pupils: Pupils are equal, round, and reactive to light.  Cardiovascular:     Rate and Rhythm: Normal rate and regular rhythm.     Pulses: Normal pulses.     Heart sounds: Normal heart sounds. No murmur.  Pulmonary:     Effort: Pulmonary effort is normal. No respiratory distress.     Breath sounds: No wheezing or rhonchi.  Chest:     Comments: Breast exam deferred using shared decision making. Abdominal:     General: Abdomen is flat. Bowel sounds are normal. There is no distension.     Palpations: Abdomen is soft.     Tenderness: There is no abdominal tenderness.  Musculoskeletal:        General: No swelling or tenderness. Normal range of motion.     Cervical back: Normal range of motion and neck supple. No rigidity or tenderness.     Right lower leg: No edema.     Left lower leg: No edema.  Skin:    General: Skin is warm and dry.     Capillary Refill: Capillary refill takes less than 2 seconds.     Coloration: Skin is not jaundiced or pale.  Neurological:     General: No focal deficit present.     Mental Status: She is alert and oriented to person, place, and time.     Motor: No weakness.     Gait: Gait normal.  Psychiatric:        Mood and Affect: Mood normal.        Behavior: Behavior normal.        Thought Content: Thought content normal.        Judgment: Judgment normal.       Assessment & Plan:   Problem List Items Addressed This Visit      Respiratory   Allergic rhinitis    Chronic, stable with intranasal spray.  Will reorder today.  Patient to return to clinic with any new concerns.      Relevant Medications   fluticasone (FLONASE) 50 MCG/ACT nasal spray  Digestive   GERD (gastroesophageal reflux disease)    Chronic, ongoing.  Patient takes omeprazole as needed and knows triggers.  Reports previous workup with EGD and GI  in past.      Relevant Medications   omeprazole (PRILOSEC) 20 MG capsule   Other Relevant Orders   Comprehensive metabolic panel   CBC with Differential/Platelet   ABO and RH     Other   Decreased hearing of both ears    Chronic, ongoing.  Follows with Audiologist and ear doctor every 3 months.  Stable with hearing aids.       Other Visit Diagnoses    Annual physical exam    -  Primary   Relevant Orders   Comprehensive metabolic panel   CBC with Differential/Platelet   Acute pain of right shoulder       Acute, ongoing.  Will treat with shoulder exercises for now.  If not better in 4-6 weeks, consider physical therapy.   Encounter for colorectal cancer screening       Relevant Orders   Cologuard   Screening for cholesterol level       Relevant Orders   Lipid Panel w/o Chol/HDL Ratio   Screening for thyroid disorder       Relevant Orders   TSH   Screening for osteoporosis       Relevant Orders   DG Bone Density       Follow up plan: Return in about 1 year (around 07/18/2020) for complete physical with fasting labs.   LABORATORY TESTING:  - Pap smear: hysterectomy total; 39 years ago   IMMUNIZATIONS:   - Tdap: Tetanus vaccination status reviewed: last tetanus booster within 10 years. - Influenza: Postponed to flu season - Pneumovax: Refused - Prevnar: Not applicable - HPV: Not applicable - Zostavax vaccine: Refused  SCREENING: -Mammogram: Up to date  - Colonoscopy: Cologuard ordered today  - Bone Density: Ordered today  -Hearing Test: Not applicable  -Spirometry: Refused   PATIENT COUNSELING:   Advised to take 1 mg of folate supplement per day if capable of pregnancy.   Sexuality: Discussed sexually transmitted diseases, partner selection, use of condoms, avoidance of unintended pregnancy  and contraceptive alternatives.   Advised to avoid cigarette smoking.  I discussed with the patient that most people either abstain from alcohol or drink within safe  limits (<=14/week and <=4 drinks/occasion for males, <=7/weeks and <= 3 drinks/occasion for females) and that the risk for alcohol disorders and other health effects rises proportionally with the number of drinks per week and how often a drinker exceeds daily limits.  Discussed cessation/primary prevention of drug use and availability of treatment for abuse.   Diet: Encouraged to adjust caloric intake to maintain  or achieve ideal body weight, to reduce intake of dietary saturated fat and total fat, to limit sodium intake by avoiding high sodium foods and not adding table salt, and to maintain adequate dietary potassium and calcium preferably from fresh fruits, vegetables, and low-fat dairy products.    stressed the importance of regular exercise  Injury prevention: Discussed safety belts, safety helmets, smoke detector, smoking near bedding or upholstery.   Dental health: Discussed importance of regular tooth brushing, flossing, and dental visits.    NEXT PREVENTATIVE PHYSICAL DUE IN 1 YEAR. Return in about 1 year (around 07/18/2020) for complete physical with fasting labs.

## 2019-07-19 NOTE — Assessment & Plan Note (Signed)
Chronic, stable with intranasal spray.  Will reorder today.  Patient to return to clinic with any new concerns.

## 2019-07-19 NOTE — Patient Instructions (Addendum)
Please call [facility] to schedule your Bone Density Scan.  [address] [place], [place] [address] [phone]   Shoulder Exercises Ask your health care provider which exercises are safe for you. Do exercises exactly as told by your health care provider and adjust them as directed. It is normal to feel mild stretching, pulling, tightness, or discomfort as you do these exercises. Stop right away if you feel sudden pain or your pain gets worse. Do not begin these exercises until told by your health care provider. Stretching exercises External rotation and abduction This exercise is sometimes called corner stretch. This exercise rotates your arm outward (external rotation) and moves your arm out from your body (abduction). 1. Stand in a doorway with one of your feet slightly in front of the other. This is called a staggered stance. If you cannot reach your forearms to the door frame, stand facing a corner of a room. 2. Choose one of the following positions as told by your health care provider: ? Place your hands and forearms on the door frame above your head. ? Place your hands and forearms on the door frame at the height of your head. ? Place your hands on the door frame at the height of your elbows. 3. Slowly move your weight onto your front foot until you feel a stretch across your chest and in the front of your shoulders. Keep your head and chest upright and keep your abdominal muscles tight. 4. Hold for __________ seconds. 5. To release the stretch, shift your weight to your back foot. Repeat __________ times. Complete this exercise __________ times a day. Extension, standing 1. Stand and hold a broomstick, a cane, or a similar object behind your back. ? Your hands should be a little wider than shoulder width apart. ? Your palms should face away from your back. 2. Keeping your elbows straight and your shoulder muscles relaxed, move the stick away from your body until  you feel a stretch in your shoulders (extension). ? Avoid shrugging your shoulders while you move the stick. Keep your shoulder blades tucked down toward the middle of your back. 3. Hold for __________ seconds. 4. Slowly return to the starting position. Repeat __________ times. Complete this exercise __________ times a day. Range-of-motion exercises Pendulum  1. Stand near a wall or a surface that you can hold onto for balance. 2. Bend at the waist and let your left / right arm hang straight down. Use your other arm to support you. Keep your back straight and do not lock your knees. 3. Relax your left / right arm and shoulder muscles, and move your hips and your trunk so your left / right arm swings freely. Your arm should swing because of the motion of your body, not because you are using your arm or shoulder muscles. 4. Keep moving your hips and trunk so your arm swings in the following directions, as told by your health care provider: ? Side to side. ? Forward and backward. ? In clockwise and counterclockwise circles. 5. Continue each motion for __________ seconds, or for as long as told by your health care provider. 6. Slowly return to the starting position. Repeat __________ times. Complete this exercise __________ times a day. Shoulder flexion, standing  1. Stand and hold a broomstick, a cane, or a similar object. Place your hands a little more than shoulder width apart on the object. Your left / right hand should be palm up, and your other hand  should be palm down. 2. Keep your elbow straight and your shoulder muscles relaxed. Push the stick up with your healthy arm to raise your left / right arm in front of your body, and then over your head until you feel a stretch in your shoulder (flexion). ? Avoid shrugging your shoulder while you raise your arm. Keep your shoulder blade tucked down toward the middle of your back. 3. Hold for __________ seconds. 4. Slowly return to the starting  position. Repeat __________ times. Complete this exercise __________ times a day. Shoulder abduction, standing 1. Stand and hold a broomstick, a cane, or a similar object. Place your hands a little more than shoulder width apart on the object. Your left / right hand should be palm up, and your other hand should be palm down. 2. Keep your elbow straight and your shoulder muscles relaxed. Push the object across your body toward your left / right side. Raise your left / right arm to the side of your body (abduction) until you feel a stretch in your shoulder. ? Do not raise your arm above shoulder height unless your health care provider tells you to do that. ? If directed, raise your arm over your head. ? Avoid shrugging your shoulder while you raise your arm. Keep your shoulder blade tucked down toward the middle of your back. 3. Hold for __________ seconds. 4. Slowly return to the starting position. Repeat __________ times. Complete this exercise __________ times a day. Internal rotation  1. Place your left / right hand behind your back, palm up. 2. Use your other hand to dangle an exercise band, a towel, or a similar object over your shoulder. Grasp the band with your left / right hand so you are holding on to both ends. 3. Gently pull up on the band until you feel a stretch in the front of your left / right shoulder. The movement of your arm toward the center of your body is called internal rotation. ? Avoid shrugging your shoulder while you raise your arm. Keep your shoulder blade tucked down toward the middle of your back. 4. Hold for __________ seconds. 5. Release the stretch by letting go of the band and lowering your hands. Repeat __________ times. Complete this exercise __________ times a day. Strengthening exercises External rotation  1. Sit in a stable chair without armrests. 2. Secure an exercise band to a stable object at elbow height on your left / right side. 3. Place a soft  object, such as a folded towel or a small pillow, between your left / right upper arm and your body to move your elbow about 4 inches (10 cm) away from your side. 4. Hold the end of the exercise band so it is tight and there is no slack. 5. Keeping your elbow pressed against the soft object, slowly move your forearm out, away from your abdomen (external rotation). Keep your body steady so only your forearm moves. 6. Hold for __________ seconds. 7. Slowly return to the starting position. Repeat __________ times. Complete this exercise __________ times a day. Shoulder abduction  1. Sit in a stable chair without armrests, or stand up. 2. Hold a __________ weight in your left / right hand, or hold an exercise band with both hands. 3. Start with your arms straight down and your left / right palm facing in, toward your body. 4. Slowly lift your left / right hand out to your side (abduction). Do not lift your hand above shoulder height  unless your health care provider tells you that this is safe. ? Keep your arms straight. ? Avoid shrugging your shoulder while you do this movement. Keep your shoulder blade tucked down toward the middle of your back. 5. Hold for __________ seconds. 6. Slowly lower your arm, and return to the starting position. Repeat __________ times. Complete this exercise __________ times a day. Shoulder extension 1. Sit in a stable chair without armrests, or stand up. 2. Secure an exercise band to a stable object in front of you so it is at shoulder height. 3. Hold one end of the exercise band in each hand. Your palms should face each other. 4. Straighten your elbows and lift your hands up to shoulder height. 5. Step back, away from the secured end of the exercise band, until the band is tight and there is no slack. 6. Squeeze your shoulder blades together as you pull your hands down to the sides of your thighs (extension). Stop when your hands are straight down by your sides. Do  not let your hands go behind your body. 7. Hold for __________ seconds. 8. Slowly return to the starting position. Repeat __________ times. Complete this exercise __________ times a day. Shoulder row 1. Sit in a stable chair without armrests, or stand up. 2. Secure an exercise band to a stable object in front of you so it is at waist height. 3. Hold one end of the exercise band in each hand. Position your palms so that your thumbs are facing the ceiling (neutral position). 4. Bend each of your elbows to a 90-degree angle (right angle) and keep your upper arms at your sides. 5. Step back until the band is tight and there is no slack. 6. Slowly pull your elbows back behind you. 7. Hold for __________ seconds. 8. Slowly return to the starting position. Repeat __________ times. Complete this exercise __________ times a day. Shoulder press-ups  1. Sit in a stable chair that has armrests. Sit upright, with your feet flat on the floor. 2. Put your hands on the armrests so your elbows are bent and your fingers are pointing forward. Your hands should be about even with the sides of your body. 3. Push down on the armrests and use your arms to lift yourself off the chair. Straighten your elbows and lift yourself up as much as you comfortably can. ? Move your shoulder blades down, and avoid letting your shoulders move up toward your ears. ? Keep your feet on the ground. As you get stronger, your feet should support less of your body weight as you lift yourself up. 4. Hold for __________ seconds. 5. Slowly lower yourself back into the chair. Repeat __________ times. Complete this exercise __________ times a day. Wall push-ups  1. Stand so you are facing a stable wall. Your feet should be about one arm-length away from the wall. 2. Lean forward and place your palms on the wall at shoulder height. 3. Keep your feet flat on the floor as you bend your elbows and lean forward toward the wall. 4. Hold for  __________ seconds. 5. Straighten your elbows to push yourself back to the starting position. Repeat __________ times. Complete this exercise __________ times a day. This information is not intended to replace advice given to you by your health care provider. Make sure you discuss any questions you have with your health care provider. Document Revised: 05/28/2018 Document Reviewed: 03/05/2018 Elsevier Patient Education  [place].

## 2019-07-20 LAB — COMPREHENSIVE METABOLIC PANEL
ALT: 11 IU/L (ref 0–32)
AST: 13 IU/L (ref 0–40)
Albumin/Globulin Ratio: 2.1 (ref 1.2–2.2)
Albumin: 4.6 g/dL (ref 3.8–4.8)
Alkaline Phosphatase: 99 IU/L (ref 48–121)
BUN/Creatinine Ratio: 30 — ABNORMAL HIGH (ref 12–28)
BUN: 20 mg/dL (ref 8–27)
Bilirubin Total: 0.4 mg/dL (ref 0.0–1.2)
CO2: 24 mmol/L (ref 20–29)
Calcium: 9.2 mg/dL (ref 8.7–10.3)
Chloride: 103 mmol/L (ref 96–106)
Creatinine, Ser: 0.66 mg/dL (ref 0.57–1.00)
GFR calc Af Amer: 106 mL/min/{1.73_m2} (ref >59)
GFR calc non Af Amer: 92 mL/min/{1.73_m2} (ref >59)
Globulin, Total: 2.2 g/dL (ref 1.5–4.5)
Glucose: 96 mg/dL (ref 65–99)
Potassium: 3.9 mmol/L (ref 3.5–5.2)
Sodium: 140 mmol/L (ref 134–144)
Total Protein: 6.8 g/dL (ref 6.0–8.5)

## 2019-07-20 LAB — CBC WITH DIFFERENTIAL/PLATELET
Basophils Absolute: 0 10*3/uL (ref 0.0–0.2)
Basos: 1 %
EOS (ABSOLUTE): 0.2 10*3/uL (ref 0.0–0.4)
Eos: 5 %
Hematocrit: 40.5 % (ref 34.0–46.6)
Hemoglobin: 13.3 g/dL (ref 11.1–15.9)
Immature Grans (Abs): 0 10*3/uL (ref 0.0–0.1)
Immature Granulocytes: 0 %
Lymphocytes Absolute: 0.5 10*3/uL — ABNORMAL LOW (ref 0.7–3.1)
Lymphs: 15 %
MCH: 29.2 pg (ref 26.6–33.0)
MCHC: 32.8 g/dL (ref 31.5–35.7)
MCV: 89 fL (ref 79–97)
Monocytes Absolute: 1.2 10*3/uL — ABNORMAL HIGH (ref 0.1–0.9)
Monocytes: 33 %
Neutrophils Absolute: 1.7 10*3/uL (ref 1.4–7.0)
Neutrophils: 46 %
Platelets: 225 10*3/uL (ref 150–450)
RBC: 4.56 x10E6/uL (ref 3.77–5.28)
RDW: 12.2 % (ref 11.7–15.4)
WBC: 3.6 10*3/uL (ref 3.4–10.8)

## 2019-07-20 LAB — LIPID PANEL W/O CHOL/HDL RATIO
Cholesterol, Total: 179 mg/dL (ref 100–199)
HDL: 47 mg/dL (ref >39)
LDL Chol Calc (NIH): 115 mg/dL — ABNORMAL HIGH (ref 0–99)
Triglycerides: 94 mg/dL (ref 0–149)
VLDL Cholesterol Cal: 17 mg/dL (ref 5–40)

## 2019-07-20 LAB — ABO AND RH: Rh Factor: POSITIVE

## 2019-07-20 LAB — TSH: TSH: 3.56 u[IU]/mL (ref 0.450–4.500)

## 2019-07-24 DIAGNOSIS — Z1211 Encounter for screening for malignant neoplasm of colon: Secondary | ICD-10-CM | POA: Diagnosis not present

## 2019-07-24 DIAGNOSIS — Z1212 Encounter for screening for malignant neoplasm of rectum: Secondary | ICD-10-CM | POA: Diagnosis not present

## 2019-07-24 LAB — COLOGUARD: Cologuard: NEGATIVE

## 2019-07-25 ENCOUNTER — Ambulatory Visit
Admission: RE | Admit: 2019-07-25 | Discharge: 2019-07-25 | Disposition: A | Discharge status: Home / Self Care | Payer: Medicare Other | Source: Ambulatory Visit | Attending: Nurse Practitioner | Admitting: Nurse Practitioner

## 2019-07-25 DIAGNOSIS — Z13820 Encounter for screening for osteoporosis: Secondary | ICD-10-CM | POA: Diagnosis not present

## 2019-07-25 DIAGNOSIS — M810 Age-related osteoporosis without current pathological fracture: Secondary | ICD-10-CM | POA: Insufficient documentation

## 2019-07-25 DIAGNOSIS — Z780 Asymptomatic menopausal state: Secondary | ICD-10-CM | POA: Diagnosis not present

## 2019-07-25 DIAGNOSIS — H903 Sensorineural hearing loss, bilateral: Secondary | ICD-10-CM | POA: Diagnosis not present

## 2019-07-25 DIAGNOSIS — H6123 Impacted cerumen, bilateral: Secondary | ICD-10-CM | POA: Diagnosis not present

## 2019-07-26 ENCOUNTER — Telehealth: Payer: Self-pay | Admitting: Nurse Practitioner

## 2019-07-26 DIAGNOSIS — M25511 Pain in right shoulder: Secondary | ICD-10-CM | POA: Diagnosis not present

## 2019-07-26 NOTE — Telephone Encounter (Signed)
Called patient and discussed DEXA scan results showing osteoporosis.  Patient remembers a time when she took calcium in the past and it made her feel terrible like she was having a heart attack.  Patient agreeable to set up appointment to discuss treatment options for osteoporosis and to check Vitamin D.

## 2019-07-26 NOTE — Telephone Encounter (Signed)
Lvm to make this apt. 

## 2019-07-30 LAB — COLOGUARD: COLOGUARD: NEGATIVE

## 2019-07-30 LAB — EXTERNAL GENERIC LAB PROCEDURE: COLOGUARD: NEGATIVE

## 2019-08-29 ENCOUNTER — Other Ambulatory Visit: Payer: Self-pay

## 2019-08-29 ENCOUNTER — Ambulatory Visit (INDEPENDENT_AMBULATORY_CARE_PROVIDER_SITE_OTHER): Payer: Medicare Other | Admitting: Nurse Practitioner

## 2019-08-29 ENCOUNTER — Encounter: Payer: Self-pay | Admitting: Nurse Practitioner

## 2019-08-29 VITALS — BP 138/82 | HR 61 | Temp 97.6°F | Ht 64.0 in | Wt 142.4 lb

## 2019-08-29 DIAGNOSIS — M810 Age-related osteoporosis without current pathological fracture: Secondary | ICD-10-CM | POA: Diagnosis not present

## 2019-08-29 NOTE — Assessment & Plan Note (Addendum)
Noted on most recent DEXA scan 07/2019 - osteopenia of spine, osteoporosis of left femur neck (z-score -2.5) and left forearm (z-score -2.8).  Calcium normal in June, will check Vitamin D today and treat as needed.  Discussed options for treatment - will proceed with bisphosphonate pending Vitamin D level.

## 2019-08-29 NOTE — Patient Instructions (Signed)
Nice seeing you today, Christina Webb.  We will let you know about your Vitamin D results later this week.  Please do not hesitate to let us know if you have any questions or concerns.  Osteoporosis  Osteoporosis is thinning and loss of density in your bones. Osteoporosis makes bones more brittle and fragile and more likely to break (fracture). Over time, osteoporosis can cause your bones to become so weak that they fracture after a minor fall. Bones in the hip, wrist, and spine are most likely to fracture due to osteoporosis. What are the causes? The exact cause of this condition is not known. What increases the risk? You may be at greater risk for osteoporosis if you:  Have a family history of the condition.  Have poor nutrition.  Use steroid medicines, such as prednisone.  Are female.  Are age 73 or older.  Smoke or have a history of smoking.  Are not physically active (are sedentary).  Are white (Caucasian) or of Asian descent.  Have a small body frame.  Take certain medicines, such as antiseizure medicines. What are the signs or symptoms? A fracture might be the first sign of osteoporosis, especially if the fracture results from a fall or injury that usually would not cause a bone to break. Other signs and symptoms include:  Pain in the neck or low back.  Stooped posture.  Loss of height. How is this diagnosed? This condition may be diagnosed based on:  Your medical history.  A physical exam.  A bone mineral density test, also called a DXA or DEXA test (dual-energy X-ray absorptiometry test). This test uses X-rays to measure the amount of minerals in your bones. How is this treated? The goal of treatment is to strengthen your bones and lower your risk for a fracture. Treatment may involve:  Making lifestyle changes, such as: ? Including foods with more calcium and vitamin D in your diet. ? Doing weight-bearing and muscle-strengthening exercises. ? Stopping tobacco  use. ? Limiting alcohol intake.  Taking medicine to slow the process of bone loss or to increase bone density.  Taking daily supplements of calcium and vitamin D.  Taking hormone replacement medicines, such as estrogen for women and testosterone for men.  Monitoring your levels of calcium and vitamin D. Follow these instructions at home:  Activity  Exercise as told by your health care provider. Ask your health care provider what exercises and activities are safe for you. You should do: ? Exercises that make you work against gravity (weight-bearing exercises), such as tai chi, yoga, or walking. ? Exercises to strengthen muscles, such as lifting weights. Lifestyle  Limit alcohol intake to no more than 1 drink a day for nonpregnant women and 2 drinks a day for men. One drink equals 12 oz of beer, 5 oz of wine, or 1 oz of hard liquor.  Do not use any products that contain nicotine or tobacco, such as cigarettes and e-cigarettes. If you need help quitting, ask your health care provider. Preventing falls  Use devices to help you move around (mobility aids) as needed, such as canes, walkers, scooters, or crutches.  Keep rooms well-lit and clutter-free.  Remove tripping hazards from walkways, including cords and throw rugs.  Install grab bars in bathrooms and safety rails on stairs.  Use rubber mats in the bathroom and other areas that are often wet or slippery.  Wear closed-toe shoes that fit well and support your feet. Wear shoes that have rubber soles or low  heels.  Review your medicines with your health care provider. Some medicines can cause dizziness or changes in blood pressure, which can increase your risk of falling. General instructions  Include calcium and vitamin D in your diet. Calcium is important for bone health, and vitamin D helps your body to absorb calcium. Good sources of calcium and vitamin D include: ? Certain fatty fish, such as salmon and tuna. ? Products  that have calcium and vitamin D added to them (fortified products), such as fortified cereals. ? Egg yolks. ? Cheese. ? Liver.  Take over-the-counter and prescription medicines only as told by your health care provider.  Keep all follow-up visits as told by your health care provider. This is important. Contact a health care provider if:  You have never been screened for osteoporosis and you are: ? A woman who is age 51 or older. ? A man who is age 34 or older. Get help right away if:  You fall or injure yourself. Summary  Osteoporosis is thinning and loss of density in your bones. This makes bones more brittle and fragile and more likely to break (fracture),even with minor falls.  The goal of treatment is to strengthen your bones and reduce your risk for a fracture.  Include calcium and vitamin D in your diet. Calcium is important for bone health, and vitamin D helps your body to absorb calcium.  Talk with your health care provider about screening for osteoporosis if you are a woman who is age 47 or older, or a man who is age 33 or older. This information is not intended to replace advice given to you by your health care provider. Make sure you discuss any questions you have with your health care provider. Document Revised: 01/16/2017 Document Reviewed: 11/28/2016 Elsevier Patient Education  2020 [place] protect organs, store calcium, anchor muscles, and support the whole body. Keeping your bones strong is important, especially as you get older. You can take actions to help keep your bones strong and healthy. Why is keeping my bones healthy important?  Keeping your bones healthy is important because your body constantly replaces bone cells. Cells get old, and new cells take their place. As we age, we lose bone cells because the body may not be able to make enough new cells to replace the old cells. The amount of bone cells and bone tissue you have is referred  to as bone mass. The higher your bone mass, the stronger your bones. The aging process leads to an overall loss of bone mass in the body, which can increase the likelihood of:  Joint pain and stiffness.  Broken bones.  A condition in which the bones become weak and brittle (osteoporosis). A large decline in bone mass occurs in older adults. In women, it occurs about the time of menopause. What actions can I take to keep my bones healthy? Good health habits are important for maintaining healthy bones. This includes eating nutritious foods and exercising regularly. To have healthy bones, you need to get enough of the right minerals and vitamins. Most nutrition experts recommend getting these nutrients from the foods that you eat. In some cases, taking supplements may also be recommended. Doing certain types of exercise is also important for bone health. What are the nutritional recommendations for healthy bones?  Eating a well-balanced diet with plenty of calcium and vitamin D will help to protect your bones. Nutritional recommendations vary from person to person. Ask your health care  provider what is healthy for you. Here are some general guidelines. Get enough calcium Calcium is the most important (essential) mineral for bone health. Most people can get enough calcium from their diet, but supplements may be recommended for people who are at risk for osteoporosis. Good sources of calcium include:  Dairy products, such as low-fat or nonfat milk, cheese, and yogurt.  Dark green leafy vegetables, such as bok choy and broccoli.  Calcium-fortified foods, such as orange juice, cereal, bread, soy beverages, and tofu products.  Nuts, such as almonds. Follow these recommended amounts for daily calcium intake:  Children, age 2-3: 700 mg.  Children, age 86-8: 1,000 mg.  Children, age 23-13: 1,300 mg.  Teens, age 861-18: 1,300 mg.  Adults, age 64-50: 1,000 mg.  Adults, age 23-70: ? Men: 1,000  mg. ? Women: 1,200 mg.  Adults, age 80 or older: 1,200 mg.  Pregnant and breastfeeding females: ? Teens: 1,300 mg. ? Adults: 1,000 mg. Get enough vitamin D Vitamin D is the most essential vitamin for bone health. It helps the body absorb calcium. Sunlight stimulates the skin to make vitamin D, so be sure to get enough sunlight. If you live in a cold climate or you do not get outside often, your health care provider may recommend that you take vitamin D supplements. Good sources of vitamin D in your diet include:  Egg yolks.  Saltwater fish.  Milk and cereal fortified with vitamin D. Follow these recommended amounts for daily vitamin D intake:  Children and teens, age 2-18: 600 international units.  Adults, age 86 or younger: 400-800 international units.  Adults, age 50 or older: 800-1,000 international units. Get other important nutrients Other nutrients that are important for bone health include:  Phosphorus. This mineral is found in meat, poultry, dairy foods, nuts, and legumes. The recommended daily intake for adult men and adult women is 700 mg.  Magnesium. This mineral is found in seeds, nuts, dark green vegetables, and legumes. The recommended daily intake for adult men is 400-420 mg. For adult women, it is 310-320 mg.  Vitamin K. This vitamin is found in green leafy vegetables. The recommended daily intake is 120 mg for adult men and 90 mg for adult women. What type of physical activity is best for building and maintaining healthy bones? Weight-bearing and strength-building activities are important for building and maintaining healthy bones. Weight-bearing activities cause muscles and bones to work against gravity. Strength-building activities increase the strength of the muscles that support bones. Weight-bearing and muscle-building activities include:  Walking and hiking.  Jogging and running.  Dancing.  Gym exercises.  Lifting weights.  Tennis and  racquetball.  Climbing stairs.  Aerobics. Adults should get at least 30 minutes of moderate physical activity on most days. Children should get at least 60 minutes of moderate physical activity on most days. Ask your health care provider what type of exercise is best for you. How can I find out if my bone mass is low? Bone mass can be measured with an X-ray test called a bone mineral density (BMD) test. This test is recommended for all women who are age 28 or older. It may also be recommended for:  Men who are age 61 or older.  People who are at risk for osteoporosis because of: ? Having bones that break easily. ? Having a long-term disease that weakens bones, such as kidney disease or rheumatoid arthritis. ? Having menopause earlier than normal. ? Taking medicine that weakens bones, such as  steroids, thyroid hormones, or hormone treatment for breast cancer or prostate cancer. ? Smoking. ? Drinking three or more alcoholic drinks a day. If you find that you have a low bone mass, you may be able to prevent osteoporosis or further bone loss by changing your diet and lifestyle. Where can I find more information? For more information, check out the following websites:  [employer]: AviationTales.fr  [employer] of Health: www.bones.SouthExposed.es  International Osteoporosis Foundation: Administrator.iofbonehealth.org Summary  The aging process leads to an overall loss of bone mass in the body, which can increase the likelihood of broken bones and osteoporosis.  Eating a well-balanced diet with plenty of calcium and vitamin D will help to protect your bones.  Weight-bearing and strength-building activities are also important for building and maintaining strong bones.  Bone mass can be measured with an X-ray test called a bone mineral density (BMD) test. This information is not intended to replace advice given to you by your health care provider. Make sure you discuss any  questions you have with your health care provider. Document Revised: 03/02/2017 Document Reviewed: 03/02/2017 Elsevier Patient Education  2020 [employer].

## 2019-08-29 NOTE — Progress Notes (Signed)
BP 138/82 (BP Location: Left Arm, Patient Position: Sitting, Cuff Size: Normal)   Pulse 61   Temp 97.6 F (36.4 C) (Oral)   Ht 5\' 4"  (1.626 m)   Wt 142 lb 6.4 oz (64.6 kg)   SpO2 100%   BMI 24.44 kg/m    Subjective:    Patient ID: , female    DOB: Nov 14, 1950, 69 y.o.   MRN: [id number]  HPI: Christina Webb is a 69 y.o. female presenting for follow up and to discuss DEXA scan results.   Chief Complaint  Patient presents with  . Follow-up    discuss dexa scan   OSTEOPOROSIS Recent DEXA scan in June showed osteopenia of spine, osteoporosis of left femur neck, and osteoporosis of left forearm.  She has never been on calcium supplementation and denies any bone pain. Satisfied with current treatment?: notcurrently on treatment Medication side effects: no Medication compliance: not currently on treatment  Past osteoporosis medications/treatments:  Adequate calcium & vitamin D: yes - eats dairy products daily - drinks 2% milk Intolerance to bisphosphonates:no Weight bearing exercises: no  Allergies  Allergen Reactions  . Sulfa Antibiotics Nausea And Vomiting and Rash  . Sulfur Rash   Outpatient Encounter Medications as of 08/29/2019  Medication Sig Note  . aspirin EC 81 MG tablet Take 81 mg by mouth at bedtime.   . betamethasone dipropionate (DIPROLENE) 0.05 % cream Apply topically 2 (two) times daily. 07/19/2019: PRN  . fluticasone (FLONASE) 50 MCG/ACT nasal spray Place 2 sprays into both nostrils daily.   . meloxicam (MOBIC) 15 MG tablet Take 15 mg by mouth daily.   09/18/2019 omeprazole (PRILOSEC) 20 MG capsule Take 1 capsule (20 mg total) by mouth daily as needed.    No facility-administered encounter medications on file as of 08/29/2019.   Patient Active Problem List   Diagnosis Date Noted  . Age-related osteoporosis without current pathological fracture 08/29/2019  . Allergic rhinitis 07/19/2019  . Osteoarthritis of both hands 08/17/2018  . Decreased hearing  of both ears 06/08/2017  . GERD (gastroesophageal reflux disease) 06/04/2015   Past Medical History:  Diagnosis Date  . GERD (gastroesophageal reflux disease)    Relevant past medical, surgical, family and social history reviewed and updated as indicated. Interim medical history since our last visit reviewed.  Review of Systems  Constitutional: Negative.   Musculoskeletal: Negative.  Negative for arthralgias, back pain, joint swelling and myalgias.  Neurological: Negative.   Psychiatric/Behavioral: Negative.    Per HPI unless specifically indicated above     Objective:    BP 138/82 (BP Location: Left Arm, Patient Position: Sitting, Cuff Size: Normal)   Pulse 61   Temp 97.6 F (36.4 C) (Oral)   Ht 5\' 4"  (1.626 m)   Wt 142 lb 6.4 oz (64.6 kg)   SpO2 100%   BMI 24.44 kg/m   Wt Readings from Last 3 Encounters:  08/29/19 142 lb 6.4 oz (64.6 kg)  07/19/19 142 lb 12.8 oz (64.8 kg)  08/17/18 132 lb (59.9 kg)    Physical Exam Constitutional:      General: She is not in acute distress.    Appearance: Normal appearance. She is not toxic-appearing.  Musculoskeletal:        General: Normal range of motion.     Right lower leg: No edema.     Left lower leg: No edema.  Neurological:     General: No focal deficit present.     Mental Status: She is  alert and oriented to person, place, and time.     Motor: No weakness.     Gait: Gait normal.  Psychiatric:        Mood and Affect: Mood normal.        Behavior: Behavior normal.        Thought Content: Thought content normal.        Judgment: Judgment normal.       Assessment & Plan:   Problem List Items Addressed This Visit      Musculoskeletal and Integument   Age-related osteoporosis without current pathological fracture - Primary    Noted on most recent DEXA scan 07/2019 - osteopenia of spine, osteoporosis of left femur neck (z-score -2.5) and left forearm (z-score -2.8).  Calcium normal in June, will check Vitamin D today  and treat as needed.  Discussed options for treatment - will proceed with bisphosphonate pending Vitamin D level.      Relevant Orders   VITAMIN D 25 Hydroxy (Vit-D Deficiency, Fractures)       Follow up plan: Return in about 3 months (around 11/29/2019) for Vitamin D follow up.

## 2019-08-30 ENCOUNTER — Telehealth: Payer: Self-pay | Admitting: Nurse Practitioner

## 2019-08-30 DIAGNOSIS — M810 Age-related osteoporosis without current pathological fracture: Secondary | ICD-10-CM

## 2019-08-30 LAB — VITAMIN D 25 HYDROXY (VIT D DEFICIENCY, FRACTURES): Vit D, 25-Hydroxy: 28 ng/mL — ABNORMAL LOW (ref 30.0–100.0)

## 2019-08-30 MED ORDER — ALENDRONATE SODIUM 70 MG PO TABS: 70.0000 mg | ORAL_TABLET | ORAL | 0 refills | Status: AC

## 2019-08-30 NOTE — Telephone Encounter (Signed)
Prescription sent in for alendronate 70 mg once weekly.

## 2019-09-19 ENCOUNTER — Telehealth: Payer: Self-pay | Admitting: Nurse Practitioner

## 2019-09-19 NOTE — Telephone Encounter (Signed)
Called pharmacy. They received the 12 tablet RX but insurance only allows for 4 at a time.   Called and let patient know this and that the pharmacy still has the other 8 tablets on file.

## 2019-09-19 NOTE — Telephone Encounter (Signed)
Pt called stating that she went to refill her alendronate and was only given 4 pills. PT is not sure wether she should stop taking the medication or not. Please advise.     [facility] [address] [place], [place] - [address]  [address] [place] [address]  Phone: [phone] Fax: [phone]  Hours: Not open 24 hours

## 2019-10-05 ENCOUNTER — Telehealth: Payer: Self-pay

## 2019-10-05 NOTE — Telephone Encounter (Signed)
° °

## 2019-10-05 NOTE — Telephone Encounter (Signed)
Duplicate message, has been sent to patients PCP. KW

## 2019-10-05 NOTE — Telephone Encounter (Signed)
Will route to Research officer, political party for review  Copied from CRM [id number]. Topic: Complaint - Billing/Coding >> Oct 05, 2019  4:20 PM Randol Kern wrote: DOS: June 1st & July 12th  Details of complaint: Pt called back and states that insurance will not cover if Shanda Bumps Asaro's name is filed, they will only cover if Dr. Aundra Millet Johnson's name is filed.  How would the patient like to see this issue resolved? Pt is asking office to resubmit claim with Dr. Henriette Combs name instead of Shanda Bumps Asaro's.   Route to Research officer, political party.  This is third time patient has called please advise

## 2019-10-13 ENCOUNTER — Telehealth: Payer: Self-pay | Admitting: Nurse Practitioner

## 2019-10-13 NOTE — Telephone Encounter (Signed)
° °  Copied from CRM [id number]. Topic: Complaint - Billing/Coding >> Oct 05, 2019  4:20 PM Randol Kern wrote: DOS: June 1st & July 12th  Details of complaint: Pt called back and states that insurance will not cover if Shanda Bumps Asaro's name is filed, they will only cover if Dr. Aundra Millet Johnson's name is filed.  How would the patient like to see this issue resolved? Pt is asking office to resubmit claim with Dr. Henriette Combs name instead of Shanda Bumps Asaro's.   Route to Research officer, political party.  This is third time patient has called please advise

## 2019-10-26 NOTE — Telephone Encounter (Signed)
Spoke with patient and informed her that her claim was reprocessed and insurance has now paid.  Also advised patient that the $35 balance was due to OV charged on the same day as CPE. Pt understood and agrees to mail in payment for copay.

## 2019-11-28 ENCOUNTER — Ambulatory Visit: Payer: Medicare Other | Admitting: Nurse Practitioner

## 2019-11-28 ENCOUNTER — Ambulatory Visit: Payer: Self-pay | Admitting: Nurse Practitioner

## 2019-12-22 ENCOUNTER — Ambulatory Visit (INDEPENDENT_AMBULATORY_CARE_PROVIDER_SITE_OTHER): Payer: Medicare Other | Admitting: Nurse Practitioner

## 2019-12-22 ENCOUNTER — Other Ambulatory Visit: Payer: Self-pay

## 2019-12-22 ENCOUNTER — Ambulatory Visit
Admission: RE | Admit: 2019-12-22 | Discharge: 2019-12-22 | Disposition: A | Discharge status: Home / Self Care | Payer: Medicare Other | Attending: Nurse Practitioner | Admitting: Nurse Practitioner

## 2019-12-22 ENCOUNTER — Encounter: Payer: Self-pay | Admitting: Nurse Practitioner

## 2019-12-22 ENCOUNTER — Ambulatory Visit
Admission: RE | Admit: 2019-12-22 | Discharge: 2019-12-22 | Disposition: A | Discharge status: Home / Self Care | Payer: Medicare Other | Source: Ambulatory Visit | Attending: Nurse Practitioner | Admitting: Nurse Practitioner

## 2019-12-22 VITALS — Temp 96.7°F

## 2019-12-22 DIAGNOSIS — R531 Weakness: Secondary | ICD-10-CM | POA: Insufficient documentation

## 2019-12-22 NOTE — Progress Notes (Signed)
Temp (!) 96.7 F (35.9 C) (Skin)    Subjective:    Patient ID: Christina Webb, female    DOB: 10-27-1950, 69 y.o.   MRN: [id number]  HPI: Christina Webb is a 69 y.o. female presenting with upper respiratory symptoms.  Chief Complaint  Patient presents with  . Generalized Body Aches    feeling week, having some chills, fatigue, no strength, no really hungery since Sunday.. Seen Dr. Jenne Campus last week for sore throat was given prednisone, and an antibiotic.Marland Kitchen   UPPER RESPIRATORY TRACT INFECTION Worst symptom: weakness and soreness; reports sore throat last week and was treated with antibiotic (Septra) for laryngitis Fever: no; 98-99 Cough: no Shortness of breath: no Wheezing: no Chest pain: no Chest tightness: no Chest congestion: no Nasal congestion: no Runny nose: no Post nasal drip: no Sneezing: no Sore throat: no Swollen glands: no Sinus pressure: no Headache: no Face pain: no Toothache: no Ear pain: no  Ear pressure: no   Body aches: yes; hips are sore Eyes red/itching:no Eye drainage/crusting: no  Nausea: no Vomiting: no  Change in appetite: yes; food does not taste Rash: no Fatigue: yes Sick contacts: no Strep contacts: no  Context: stable Recurrent sinusitis: no Relief with OTC cold/cough medications:  no Treatments attempted:  nothing  Allergies  Allergen Reactions  . Sulfa Antibiotics Nausea And Vomiting and Rash  . Sulfur Rash   Outpatient Encounter Medications as of 12/22/2019  Medication Sig Note  . alendronate (FOSAMAX) 70 MG tablet Take 1 tablet (70 mg total) by mouth every 7 (seven) days. Take with a full glass of water on an empty stomach.   Marland Kitchen aspirin EC 81 MG tablet Take 81 mg by mouth at bedtime.   . betamethasone dipropionate (DIPROLENE) 0.05 % cream Apply topically 2 (two) times daily. 07/19/2019: PRN  . fluticasone (FLONASE) 50 MCG/ACT nasal spray Place 2 sprays into both nostrils daily.   Marland Kitchen omeprazole (PRILOSEC) 20 MG capsule Take  1 capsule (20 mg total) by mouth daily as needed.   . meloxicam (MOBIC) 15 MG tablet Take 15 mg by mouth daily. (Patient not taking: Reported on 12/22/2019)    No facility-administered encounter medications on file as of 12/22/2019.   Patient Active Problem List   Diagnosis Date Noted  . Age-related osteoporosis without current pathological fracture 08/29/2019  . Allergic rhinitis 07/19/2019  . Osteoarthritis of both hands 08/17/2018  . Decreased hearing of both ears 06/08/2017  . GERD (gastroesophageal reflux disease) 06/04/2015   Past Medical History:  Diagnosis Date  . GERD (gastroesophageal reflux disease)    Relevant past medical, surgical, family and social history reviewed and updated as indicated. Interim medical history since our last visit reviewed.  Review of Systems  Constitutional: Positive for appetite change. Negative for activity change, fatigue and fever.  HENT: Negative.  Negative for congestion, dental problem, ear pain, postnasal drip, rhinorrhea, sinus pressure, sinus pain, sneezing, sore throat and trouble swallowing.   Eyes: Negative.  Negative for visual disturbance.  Respiratory: Negative.  Negative for shortness of breath and wheezing.   Cardiovascular: Negative.  Negative for chest pain.  Gastrointestinal: Negative.  Negative for nausea and vomiting.  Musculoskeletal: Positive for arthralgias (bilateral hip soreness). Negative for gait problem.  Skin: Negative.  Negative for rash.  Neurological: Positive for weakness.    Per HPI unless specifically indicated above     Objective:    Temp (!) 96.7 F (35.9 C) (Skin)   Wt Readings from Last 3  Encounters:  08/29/19 142 lb 6.4 oz (64.6 kg)  07/19/19 142 lb 12.8 oz (64.8 kg)  08/17/18 132 lb (59.9 kg)    Physical Exam Physical examination unable to be performed due to lack of equipment.     Assessment & Plan:   Problem List Items Addressed This Visit    None    Visit Diagnoses    Weakness    -   Primary   Unclear etiology, likely viral.  Encouraged to obtain Covid testing and isolate at home until results are back.  also obtain chest x-ray to rule out pneumonia.   Relevant Orders   DG Chest 2 View       Follow up plan: Return if symptoms worsen or fail to improve.  This visit was completed via telephone due to the restrictions of the COVID-19 pandemic. All issues as above were discussed and addressed but no physical exam was performed. If it was felt that the patient should be evaluated in the office, they were directed there. The patient verbally consented to this visit. Patient was unable to complete an audio/visual visit due to Lack of equipment. . Location of the patient: home . Location of the provider: work . Those involved with this call:  . Provider: Mardene Celeste, DNP . CMA: Wilhemena Durie, CMA . Front Desk/Registration: Harriet Pho  . Time spent on call: 12 minutes on the phone discussing health concerns. 30 minutes total spent in review of patient's record and preparation of their chart.  I verified patient identity using two factors (patient name and date of birth). Patient consents verbally to being seen via telemedicine visit today.

## 2019-12-23 ENCOUNTER — Emergency Department: Payer: Medicare Other

## 2019-12-23 ENCOUNTER — Telehealth: Payer: Self-pay

## 2019-12-23 ENCOUNTER — Emergency Department
Admission: EM | Admit: 2019-12-23 | Discharge: 2019-12-23 | Disposition: A | Discharge status: Home / Self Care | Payer: Medicare Other | Attending: Emergency Medicine | Admitting: Emergency Medicine

## 2019-12-23 ENCOUNTER — Other Ambulatory Visit: Payer: Self-pay

## 2019-12-23 DIAGNOSIS — U071 COVID-19: Secondary | ICD-10-CM | POA: Diagnosis not present

## 2019-12-23 DIAGNOSIS — R112 Nausea with vomiting, unspecified: Secondary | ICD-10-CM | POA: Diagnosis present

## 2019-12-23 DIAGNOSIS — R1110 Vomiting, unspecified: Secondary | ICD-10-CM

## 2019-12-23 DIAGNOSIS — Z7982 Long term (current) use of aspirin: Secondary | ICD-10-CM | POA: Diagnosis not present

## 2019-12-23 DIAGNOSIS — R531 Weakness: Secondary | ICD-10-CM

## 2019-12-23 LAB — CBC WITH DIFFERENTIAL/PLATELET
Abs Immature Granulocytes: 0.03 10*3/uL (ref 0.00–0.07)
Basophils Absolute: 0 10*3/uL (ref 0.0–0.1)
Basophils Relative: 0 %
Eosinophils Absolute: 0 10*3/uL (ref 0.0–0.5)
Eosinophils Relative: 0 %
HCT: 43.4 % (ref 36.0–46.0)
Hemoglobin: 14 g/dL (ref 12.0–15.0)
Immature Granulocytes: 1 %
Lymphocytes Relative: 14 %
Lymphs Abs: 0.8 10*3/uL (ref 0.7–4.0)
MCH: 29.4 pg (ref 26.0–34.0)
MCHC: 32.3 g/dL (ref 30.0–36.0)
MCV: 91.2 fL (ref 80.0–100.0)
Monocytes Absolute: 0.4 10*3/uL (ref 0.1–1.0)
Monocytes Relative: 6 %
Neutro Abs: 4.9 10*3/uL (ref 1.7–7.7)
Neutrophils Relative %: 79 %
Platelets: 204 10*3/uL (ref 150–400)
RBC: 4.76 MIL/uL (ref 3.87–5.11)
RDW: 13 % (ref 11.5–15.5)
WBC: 6.1 10*3/uL (ref 4.0–10.5)
nRBC: 0 % (ref 0.0–0.2)

## 2019-12-23 LAB — COMPREHENSIVE METABOLIC PANEL
ALT: 17 U/L (ref 0–44)
AST: 33 U/L (ref 15–41)
Albumin: 4.1 g/dL (ref 3.5–5.0)
Alkaline Phosphatase: 65 U/L (ref 38–126)
Anion gap: 15 (ref 5–15)
BUN: 25 mg/dL — ABNORMAL HIGH (ref 8–23)
CO2: 25 mmol/L (ref 22–32)
Calcium: 8.5 mg/dL — ABNORMAL LOW (ref 8.9–10.3)
Chloride: 106 mmol/L (ref 98–111)
Creatinine, Ser: 0.79 mg/dL (ref 0.44–1.00)
GFR, Estimated: >60 mL/min (ref >60)
Glucose, Bld: 138 mg/dL — ABNORMAL HIGH (ref 70–99)
Potassium: 3.2 mmol/L — ABNORMAL LOW (ref 3.5–5.1)
Sodium: 146 mmol/L — ABNORMAL HIGH (ref 135–145)
Total Bilirubin: 1.2 mg/dL (ref 0.3–1.2)
Total Protein: 7.9 g/dL (ref 6.5–8.1)

## 2019-12-23 MED ORDER — PREDNISONE 10 MG (21) PO TBPK: ORAL_TABLET | ORAL | 0 refills | Status: AC

## 2019-12-23 MED ORDER — ONDANSETRON HCL 4 MG/2ML IJ SOLN
4.0000 mg | Freq: Once | INTRAMUSCULAR | Status: AC
  Administered 2019-12-23: 4 mg via INTRAVENOUS
  Filled 2019-12-23: qty 2

## 2019-12-23 MED ORDER — SODIUM CHLORIDE 0.9 % IV BOLUS
1000.0000 mL | Freq: Once | INTRAVENOUS | Status: AC
  Administered 2019-12-23: 1000 mL via INTRAVENOUS

## 2019-12-23 MED ORDER — ONDANSETRON HCL 4 MG PO TABS: 4.0000 mg | ORAL_TABLET | Freq: Three times a day (TID) | ORAL | 0 refills | Status: AC | PRN

## 2019-12-23 NOTE — Discharge Instructions (Addendum)
As we discussed please have someone bring you a portable pulse oximeter, this device can measure your blood oxygen levels. If they were to fall below 90 you would need to return to the emergency department. Please seek medical attention for any high fevers, chest pain, shortness of breath, change in behavior, persistent vomiting, bloody stool or any other new or concerning symptoms.

## 2019-12-23 NOTE — Telephone Encounter (Signed)
Christina Webb is there anything we can do for the patient over the weekend? She had COVID test yesterday and had chest x-ray but it has not been read yet.

## 2019-12-23 NOTE — ED Provider Notes (Signed)
[name] Emergency Department Provider Note  ____________________________________________   I have reviewed the triage vital signs and the nursing notes.   HISTORY  Chief Complaint COVID+   History limited by: Not Limited   HPI SOLEY Webb is a 69 y.o. female who presents to the emergency department today because of symptoms consistent with covid. The patient was diagnosed with covid yesterday. Comes in today because she has been having nausea and vomiting. Has been having body aches and sore throat for one week. The patient denies any shortness of breath.  Records reviewed. Per medical record review patient has a history of GERD, abdominal hysterectomy.   Past Medical History:  Diagnosis Date  . GERD (gastroesophageal reflux disease)     Patient Active Problem List   Diagnosis Date Noted  . Age-related osteoporosis without current pathological fracture 08/29/2019  . Allergic rhinitis 07/19/2019  . Osteoarthritis of both hands 08/17/2018  . Decreased hearing of both ears 06/08/2017  . GERD (gastroesophageal reflux disease) 06/04/2015    Past Surgical History:  Procedure Laterality Date  . ABDOMINAL HYSTERECTOMY    . CESAREAN SECTION    . EYE SURGERY Bilateral    cataract  . LAPAROSCOPIC OOPHERECTOMY    . TYMPANOPLASTY  1983    Prior to Admission medications   Medication Sig Start Date End Date Taking? Authorizing Provider  alendronate (FOSAMAX) 70 MG tablet Take 1 tablet (70 mg total) by mouth every 7 (seven) days. Take with a full glass of water on an empty stomach. 08/30/19   Valentino Nose, NP  aspirin EC 81 MG tablet Take 81 mg by mouth at bedtime.    [provider]  betamethasone dipropionate (DIPROLENE) 0.05 % cream Apply topically 2 (two) times daily. 10/18/18   Steele Sizer, MD  fluticasone (FLONASE) 50 MCG/ACT nasal spray Place 2 sprays into both nostrils daily. 07/19/19   Valentino Nose, NP  meloxicam  (MOBIC) 15 MG tablet Take 15 mg by mouth daily. Patient not taking: Reported on 12/22/2019 07/26/19   [provider]  omeprazole (PRILOSEC) 20 MG capsule Take 1 capsule (20 mg total) by mouth daily as needed. 07/19/19   Valentino Nose, NP  predniSONE (STERAPRED UNI-PAK 21 TAB) 10 MG (21) TBPK tablet Take 6 tablets (60 mg total) by mouth daily for 1 day, THEN 5 tablets (50 mg total) daily for 1 day, THEN 4 tablets (40 mg total) daily for 1 day, THEN 3 tablets (30 mg total) daily for 1 day, THEN 2 tablets (20 mg total) daily for 1 day, THEN 1 tablet (10 mg total) daily for 1 day. 12/23/19 12/29/19  Valentino Nose, NP    Allergies Sulfa antibiotics and Sulfur  Family History  Problem Relation Age of Onset  . Hyperlipidemia Mother   . Hypertension Mother   . COPD Father   . Breast cancer Paternal Aunt     Social History Social History   Tobacco Use  . Smoking status: Never Smoker  . Smokeless tobacco: Never Used  Substance Use Topics  . Alcohol use: No  . Drug use: No    Review of Systems Constitutional: Positive for generalized weakness. Eyes: No visual changes. ENT: No sore throat. Cardiovascular: Denies chest pain. Respiratory: Denies shortness of breath. Gastrointestinal: Positive for nausea, vomiting.  Genitourinary: Negative for dysuria. Musculoskeletal: Positive for body aches. Skin: Negative for rash. Neurological: Positive for focal weakness or numbness.  ____________________________________________   PHYSICAL EXAM:  VITAL SIGNS: ED  Triage Vitals  Enc Vitals Group     BP 12/23/19 1822 135/73     Pulse Rate 12/23/19 1822 (!) 121     Resp 12/23/19 1822 (!) 24     Temp 12/23/19 1822 (!) 100.5 F (38.1 C)     Temp Source 12/23/19 1822 Oral     SpO2 12/23/19 1822 91 %     Weight 12/23/19 1823 138 lb (62.6 kg)     Height 12/23/19 1823 5\' 4"  (1.626 m)     Head Circumference --      Peak Flow --      Pain Score 12/23/19 1823 0   Constitutional:  Alert and oriented.  Eyes: Conjunctivae are normal.  ENT      Head: Normocephalic and atraumatic.      Nose: No congestion/rhinnorhea.      Mouth/Throat: Mucous membranes are moist.      Neck: No stridor. Hematological/Lymphatic/Immunilogical: No cervical lymphadenopathy. Cardiovascular: Normal rate, regular rhythm.  No murmurs, rubs, or gallops.  Respiratory: Normal respiratory effort without tachypnea nor retractions. Breath sounds are clear and equal bilaterally. No wheezes/rales/rhonchi. Gastrointestinal: Soft and non tender. No rebound. No guarding.  Genitourinary: Deferred Musculoskeletal: Normal range of motion in all extremities. No lower extremity edema. Neurologic:  Normal speech and language. No gross focal neurologic deficits are appreciated.  Skin:  Skin is warm, dry and intact. No rash noted. Psychiatric: Mood and affect are normal. Speech and behavior are normal. Patient exhibits appropriate insight and judgment.  ____________________________________________    LABS (pertinent positives/negatives)  CBC wbc 6.1, hgb 14.0, plt 204 CMP na 146, k 3.2, glu 138, cr 0.79 ____________________________________________   EKG  I, 13/05/21, attending physician, personally viewed and interpreted this EKG  EKG Time: 1828 Rate: 125 Rhythm: sinus tachycardia Axis: normal Intervals: qtc 461 QRS: narrow ST changes: no st elevation Impression: abnormal ekg   ____________________________________________    RADIOLOGY  CXR Consistent with covid  ____________________________________________   PROCEDURES  Procedures  ____________________________________________   INITIAL IMPRESSION / ASSESSMENT AND PLAN / ED COURSE  Pertinent labs & imaging results that were available during my care of the patient were reviewed by me and considered in my medical decision making (see chart for details).   Patient presented to the emergency department today with symptoms  consistent with known diagnosis of covid. Patient was given IVFs and zofran and felt better. Blood work consistent with decreased oral intake secondary to the nausea. Patient not hypoxic in the ER. Did send information to [facility] infusion clinic. Discussed findings and plan with patient.    ____________________________________________   FINAL CLINICAL IMPRESSION(S) / ED DIAGNOSES  Final diagnoses:  Vomiting  Weakness  COVID-19 virus infection     Note: This dictation was prepared with Dragon dictation. Any transcriptional errors that result from this process are unintentional      Phineas Semen, MD 12/23/19 2013

## 2019-12-23 NOTE — ED Triage Notes (Signed)
Pt to ED POV for chief complaint of COVID + since yesterday.  Denies SHOB,  + vomiting, weakness and nausea since this AM.  States body aches and sore throat for one week Pt speaking in complete sentences, clear speech, alert and oriented

## 2019-12-23 NOTE — Telephone Encounter (Signed)
Copied from CRM [id number]. Topic: General - Other >> Dec 23, 2019  1:03 PM Lyn Hollingshead D wrote: PT had an appt yesterday, still no felling well and need to speak with a nurse / please advise

## 2019-12-24 ENCOUNTER — Other Ambulatory Visit: Payer: Self-pay | Admitting: Physician Assistant

## 2019-12-24 ENCOUNTER — Ambulatory Visit (HOSPITAL_COMMUNITY)
Admission: RE | Admit: 2019-12-24 | Discharge: 2019-12-24 | Disposition: A | Discharge status: Home / Self Care | Payer: Medicare Other | Source: Ambulatory Visit | Attending: Pulmonary Disease | Admitting: Pulmonary Disease

## 2019-12-24 DIAGNOSIS — U071 COVID-19: Secondary | ICD-10-CM | POA: Diagnosis present

## 2019-12-24 DIAGNOSIS — R54 Age-related physical debility: Secondary | ICD-10-CM | POA: Insufficient documentation

## 2019-12-24 DIAGNOSIS — Z23 Encounter for immunization: Secondary | ICD-10-CM | POA: Insufficient documentation

## 2019-12-24 MED ORDER — SOTROVIMAB 500 MG/8ML IV SOLN
500.0000 mg | Freq: Once | INTRAVENOUS | Status: AC
  Administered 2019-12-24: 500 mg via INTRAVENOUS

## 2019-12-24 MED ORDER — ALBUTEROL SULFATE HFA 108 (90 BASE) MCG/ACT IN AERS: 2.0000 | INHALATION_SPRAY | Freq: Once | RESPIRATORY_TRACT | Status: DC | PRN

## 2019-12-24 MED ORDER — FAMOTIDINE IN NACL 20-0.9 MG/50ML-% IV SOLN: 20.0000 mg | Freq: Once | INTRAVENOUS | Status: DC | PRN

## 2019-12-24 MED ORDER — DIPHENHYDRAMINE HCL 50 MG/ML IJ SOLN: 50.0000 mg | Freq: Once | INTRAMUSCULAR | Status: DC | PRN

## 2019-12-24 MED ORDER — SODIUM CHLORIDE 0.9 % IV SOLN: INTRAVENOUS | Status: DC | PRN

## 2019-12-24 MED ORDER — EPINEPHRINE 0.3 MG/0.3ML IJ SOAJ: 0.3000 mg | Freq: Once | INTRAMUSCULAR | Status: DC | PRN

## 2019-12-24 MED ORDER — METHYLPREDNISOLONE SODIUM SUCC 125 MG IJ SOLR: 125.0000 mg | Freq: Once | INTRAMUSCULAR | Status: DC | PRN

## 2019-12-24 NOTE — Discharge Instructions (Signed)

## 2019-12-24 NOTE — Progress Notes (Signed)
  Diagnosis: COVID-19  Physician: Dr. Patrick Wright  Procedure: Covid Infusion Clinic Med: Sotrovimab - Provided patient with sotrovimab fact sheet for patients, parents, and caregivers prior to infusion.   Complications: No immediate complications noted.  Discharge: Discharged home   Christina Webb 12/24/2019  

## 2019-12-24 NOTE — Progress Notes (Signed)
I connected by phone with Christina Webb on 12/24/2019 at 9:05 AM to discuss the potential use of a new treatment for mild to moderate COVID-19 viral infection in non-hospitalized patients.  This patient is a 69 y.o. female that meets the FDA criteria for Emergency Use Authorization of COVID monoclonal antibody casirivimab/imdevimab, bamlanivimab/eteseviamb, or sotrovimab.  Has a (+) direct SARS-CoV-2 viral test result  Has mild or moderate COVID-19   Is NOT hospitalized due to COVID-19  Is within 10 days of symptom onset  Has at least one of the high risk factor(s) for progression to severe COVID-19 and/or hospitalization as defined in EUA.  Specific high risk criteria : Older age (>/= 67 yo) and Other high risk medical condition per CDC:  SVI   I have spoken and communicated the following to the patient or parent/caregiver regarding COVID monoclonal antibody treatment:  1. FDA has authorized the emergency use for the treatment of mild to moderate COVID-19 in adults and pediatric patients with positive results of direct SARS-CoV-2 viral testing who are 69 years of age and older weighing at least 40 kg, and who are at high risk for progressing to severe COVID-19 and/or hospitalization.  2. The significant known and potential risks and benefits of COVID monoclonal antibody, and the extent to which such potential risks and benefits are unknown.  3. Information on available alternative treatments and the risks and benefits of those alternatives, including clinical trials.  4. Patients treated with COVID monoclonal antibody should continue to self-isolate and use infection control measures (e.g., wear mask, isolate, social distance, avoid sharing personal items, clean and disinfect "high touch" surfaces, and frequent handwashing) according to CDC guidelines.   5. The patient or parent/caregiver has the option to accept or refuse COVID monoclonal antibody treatment.  After reviewing this  information with the patient, the patient has agreed to receive one of the available covid 19 monoclonal antibodies and will be provided an appropriate fact sheet prior to infusion. Christina Webb, [place] 12/24/2019 9:05 AM

## 2019-12-26 NOTE — Telephone Encounter (Signed)
Clinical: Looks like patient was in ED over weekend and has tested positive for COVID-19.  Please let her know her chest x-ray reflects this.  Has she started the steroid and is that helping with her symptoms at all?    Admin: Please schedule virtual with her for sometime this week, tomorrow if possible for COVID follow up.

## 2019-12-26 NOTE — Telephone Encounter (Signed)
Xray has been read

## 2019-12-26 NOTE — Telephone Encounter (Signed)
Called and spoke with patient. She did not start Prednisone because she states that it does not help her. She states that she is feeling a little better today. Virtual appointment scheduled for tomorrow for patient.

## 2019-12-27 ENCOUNTER — Encounter: Payer: Self-pay | Admitting: Nurse Practitioner

## 2019-12-27 ENCOUNTER — Other Ambulatory Visit: Payer: Self-pay

## 2019-12-27 ENCOUNTER — Telehealth (INDEPENDENT_AMBULATORY_CARE_PROVIDER_SITE_OTHER): Payer: Medicare Other | Admitting: Nurse Practitioner

## 2019-12-27 DIAGNOSIS — U071 COVID-19: Secondary | ICD-10-CM | POA: Diagnosis not present

## 2019-12-27 NOTE — Patient Instructions (Signed)
Person Under Monitoring Name: Christina Webb  Location: [address] [place] [address]   Infection Prevention Recommendations for Individuals Confirmed to have, or Being Evaluated for, 2019 Novel Coronavirus (COVID-19) Infection Who Receive Care at Home  Individuals who are confirmed to have, or are being evaluated for, COVID-19 should follow the prevention steps below until a healthcare provider or local or state health department says they can return to normal activities.  Stay home except to get medical care You should restrict activities outside your home, except for getting medical care. Do not go to work, school, or public areas, and do not use public transportation or taxis.  Call ahead before visiting your doctor Before your medical appointment, call the healthcare provider and tell them that you have, or are being evaluated for, COVID-19 infection. This will help the healthcare providers office take steps to keep other people from getting infected. Ask your healthcare provider to call the local or state health department.  Monitor your symptoms Seek prompt medical attention if your illness is worsening (e.g., difficulty breathing). Before going to your medical appointment, call the healthcare provider and tell them that you have, or are being evaluated for, COVID-19 infection. Ask your healthcare provider to call the local or state health department.  Wear a facemask You should wear a facemask that covers your nose and mouth when you are in the same room with other people and when you visit a healthcare provider. People who live with or visit you should also wear a facemask while they are in the same room with you.  Separate yourself from other people in your home As much as possible, you should stay in a different room from other people in your home. Also, you should use a separate bathroom, if available.  Avoid sharing household items You should not  share dishes, drinking glasses, cups, eating utensils, towels, bedding, or other items with other people in your home. After using these items, you should wash them thoroughly with soap and water.  Cover your coughs and sneezes Cover your mouth and nose with a tissue when you cough or sneeze, or you can cough or sneeze into your sleeve. Throw used tissues in a lined trash can, and immediately wash your hands with soap and water for at least 20 seconds or use an alcohol-based hand rub.  Wash your [employer] your hands often and thoroughly with soap and water for at least 20 seconds. You can use an alcohol-based hand sanitizer if soap and water are not available and if your hands are not visibly dirty. Avoid touching your eyes, nose, and mouth with unwashed hands.   Prevention Steps for Caregivers and Household Members of Individuals Confirmed to have, or Being Evaluated for, COVID-19 Infection Being Cared for in the Home  If you live with, or provide care at home for, a person confirmed to have, or being evaluated for, COVID-19 infection please follow these guidelines to prevent infection:  Follow healthcare providers instructions Make sure that you understand and can help the patient follow any healthcare provider instructions for all care.  Provide for the patients basic needs You should help the patient with basic needs in the home and provide support for getting groceries, prescriptions, and other personal needs.  Monitor the patients symptoms If they are getting sicker, call his or her medical provider and tell them that the patient has, or is being evaluated for, COVID-19 infection. This will help the healthcare providers office  take steps to keep other people from getting infected. Ask the healthcare provider to call the local or state health department.  Limit the number of people who have contact with the patient  If possible, have only one caregiver for the  patient.  Other household members should stay in another home or place of residence. If this is not possible, they should stay  in another room, or be separated from the patient as much as possible. Use a separate bathroom, if available.  Restrict visitors who do not have an essential need to be in the home.  Keep older adults, very young children, and other sick people away from the patient Keep older adults, very young children, and those who have compromised immune systems or chronic health conditions away from the patient. This includes people with chronic heart, lung, or kidney conditions, diabetes, and cancer.  Ensure good ventilation Make sure that shared spaces in the home have good air flow, such as from an air conditioner or an opened window, weather permitting.  Wash your hands often  Wash your hands often and thoroughly with soap and water for at least 20 seconds. You can use an alcohol based hand sanitizer if soap and water are not available and if your hands are not visibly dirty.  Avoid touching your eyes, nose, and mouth with unwashed hands.  Use disposable paper towels to dry your hands. If not available, use dedicated cloth towels and replace them when they become wet.  Wear a facemask and gloves  Wear a disposable facemask at all times in the room and gloves when you touch or have contact with the patients blood, body fluids, and/or secretions or excretions, such as sweat, saliva, sputum, nasal mucus, vomit, urine, or feces.  Ensure the mask fits over your nose and mouth tightly, and do not touch it during use.  Throw out disposable facemasks and gloves after using them. Do not reuse.  Wash your hands immediately after removing your facemask and gloves.  If your personal clothing becomes contaminated, carefully remove clothing and launder. Wash your hands after handling contaminated clothing.  Place all used disposable facemasks, gloves, and other waste in a lined  container before disposing them with other household waste.  Remove gloves and wash your hands immediately after handling these items.  Do not share dishes, glasses, or other household items with the patient  Avoid sharing household items. You should not share dishes, drinking glasses, cups, eating utensils, towels, bedding, or other items with a patient who is confirmed to have, or being evaluated for, COVID-19 infection.  After the person uses these items, you should wash them thoroughly with soap and water.  Wash laundry thoroughly  Immediately remove and wash clothes or bedding that have blood, body fluids, and/or secretions or excretions, such as sweat, saliva, sputum, nasal mucus, vomit, urine, or feces, on them.  Wear gloves when handling laundry from the patient.  Read and follow directions on labels of laundry or clothing items and detergent. In general, wash and dry with the warmest temperatures recommended on the label.  Clean all areas the individual has used often  Clean all touchable surfaces, such as counters, tabletops, doorknobs, bathroom fixtures, toilets, phones, keyboards, tablets, and bedside tables, every day. Also, clean any surfaces that may have blood, body fluids, and/or secretions or excretions on them.  Wear gloves when cleaning surfaces the patient has come in contact with.  Use a diluted bleach solution (e.g., dilute bleach with 1 part  bleach and 10 parts water) or a household disinfectant with a label that says EPA-registered for coronaviruses. To make a bleach solution at home, add 1 tablespoon of bleach to 1 quart (4 cups) of water. For a larger supply, add  cup of bleach to 1 gallon (16 cups) of water.  Read labels of cleaning products and follow recommendations provided on product labels. Labels contain instructions for safe and effective use of the cleaning product including precautions you should take when applying the product, such as wearing gloves or  eye protection and making sure you have good ventilation during use of the product.  Remove gloves and wash hands immediately after cleaning.  Monitor yourself for signs and symptoms of illness Caregivers and household members are considered close contacts, should monitor their health, and will be asked to limit movement outside of the home to the extent possible. Follow the monitoring steps for close contacts listed on the symptom monitoring form.   ? If you have additional questions, contact your local health department or call the epidemiologist on call at [phone] (available 24/7). ? This guidance is subject to change. For the most up-to-date guidance from [facility], please refer to their website: [email or web address]

## 2019-12-27 NOTE — Assessment & Plan Note (Signed)
Acute, ongoing. Symptoms started 10/31 and diagnosed 11/5. Received monoclonal antibody infusion 11/6. With no shortness of breath or chest pain today, worse symptom is a sore tongue.  Concerned that she has not been able to take medications or eat or drink much of anything the past couple of days. Started steroid taper but only took 1 day of this. Encouraged to push fluids and check blood pressure-if less than 100/60, notify clinic. If symptoms do not improve by end of week, notify clinic. With any sudden onset of chest pain or shortness of breath, go to ER.

## 2019-12-27 NOTE — Progress Notes (Signed)
There were no vitals taken for this visit.   Subjective:    Patient ID: Christina Webb, female    DOB: Apr 12, 1950, 69 y.o.   MRN: [id number]  HPI: Christina Webb is a 69 y.o. female presenting for COVID-19 follow up.  Chief Complaint  Patient presents with  . Covid Positive    pt states she was diagnosed with Covid on 12/23/19. States she is feeling better but has a very dry and sore mouth. States nothing can touch her tongue, has not taken any medications because of it.    UPPER RESPIRATORY TRACT INFECTION Worst symptom: dry mouth Fever: no Cough: yes; productive Shortness of breath: no Wheezing: no Chest pain: no Chest tightness: no Chest congestion: yes Nasal congestion: no Runny nose: no Post nasal drip: no Sneezing: no Sore throat: no Swollen glands: no Sinus pressure: no Headache: no Face pain: no  Sore tongue: yes Toothache: no Ear pain: no  Ear pressure: no  Eyes red/itching:no Eye drainage/crusting: no  Nausea: no Vomiting: no  Change in taste: yes, everything tastes sweet Rash: no Fatigue: no Sick contacts: no Strep contacts: no  Context: better Recurrent sinusitis: no Relief with OTC cold/cough medications: yes  Treatments attempted:  Unable to take any medication right now; reports it gives her the dry heaves.  Allergies  Allergen Reactions  . Sulfa Antibiotics Nausea And Vomiting and Rash  . Sulfur Rash   Outpatient Encounter Medications as of 12/27/2019  Medication Sig Note  . alendronate (FOSAMAX) 70 MG tablet Take 1 tablet (70 mg total) by mouth every 7 (seven) days. Take with a full glass of water on an empty stomach. (Patient not taking: Reported on 12/27/2019)   . aspirin EC 81 MG tablet Take 81 mg by mouth at bedtime. (Patient not taking: Reported on 12/27/2019)   . betamethasone dipropionate (DIPROLENE) 0.05 % cream Apply topically 2 (two) times daily. (Patient not taking: Reported on 12/27/2019) 07/19/2019: PRN  . fluticasone  (FLONASE) 50 MCG/ACT nasal spray Place 2 sprays into both nostrils daily. (Patient not taking: Reported on 12/27/2019)   . meloxicam (MOBIC) 15 MG tablet Take 15 mg by mouth daily. (Patient not taking: Reported on 12/22/2019)   . omeprazole (PRILOSEC) 20 MG capsule Take 1 capsule (20 mg total) by mouth daily as needed. (Patient not taking: Reported on 12/27/2019)   . ondansetron (ZOFRAN) 4 MG tablet Take 1 tablet (4 mg total) by mouth every 8 (eight) hours as needed. (Patient not taking: Reported on 12/27/2019)   . predniSONE (STERAPRED UNI-PAK 21 TAB) 10 MG (21) TBPK tablet Take 6 tablets (60 mg total) by mouth daily for 1 day, THEN 5 tablets (50 mg total) daily for 1 day, THEN 4 tablets (40 mg total) daily for 1 day, THEN 3 tablets (30 mg total) daily for 1 day, THEN 2 tablets (20 mg total) daily for 1 day, THEN 1 tablet (10 mg total) daily for 1 day. (Patient not taking: Reported on 12/27/2019)    No facility-administered encounter medications on file as of 12/27/2019.   Patient Active Problem List   Diagnosis Date Noted  . COVID-19 12/27/2019  . Age-related osteoporosis without current pathological fracture 08/29/2019  . Allergic rhinitis 07/19/2019  . Osteoarthritis of both hands 08/17/2018  . Decreased hearing of both ears 06/08/2017  . GERD (gastroesophageal reflux disease) 06/04/2015   Past Medical History:  Diagnosis Date  . GERD (gastroesophageal reflux disease)    Relevant past medical, surgical, family and social history  reviewed and updated as indicated. Interim medical history since our last visit reviewed.  Review of Systems  Constitutional: Positive for appetite change. Negative for activity change, diaphoresis, fatigue and fever.  HENT: Positive for congestion (chest congestion) and trouble swallowing. Negative for drooling, ear discharge, ear pain, postnasal drip, rhinorrhea, sinus pressure, sinus pain, sneezing, sore throat and tinnitus.        + tongue pain  Eyes: Negative.   Negative for pain, discharge, redness, itching and visual disturbance.  Respiratory: Negative.  Negative for chest tightness, shortness of breath and wheezing.   Cardiovascular: Negative.  Negative for chest pain.  Gastrointestinal: Negative for abdominal pain, nausea and vomiting.  Skin: Negative.  Negative for rash.  Neurological: Negative.   Psychiatric/Behavioral: Negative.     Per HPI unless specifically indicated above     Objective:    There were no vitals taken for this visit.  Wt Readings from Last 3 Encounters:  12/23/19 138 lb (62.6 kg)  08/29/19 142 lb 6.4 oz (64.6 kg)  07/19/19 142 lb 12.8 oz (64.8 kg)    Physical Exam  Physical examination unable to be performed due to lack of equipment.     Assessment & Plan:   Problem List Items Addressed This Visit      Other   COVID-19 - Primary    Acute, ongoing. Symptoms started 10/31 and diagnosed 11/5. Received monoclonal antibody infusion 11/6. With no shortness of breath or chest pain today, worse symptom is a sore tongue.  Concerned that she has not been able to take medications or eat or drink much of anything the past couple of days. Started steroid taper but only took 1 day of this. Encouraged to push fluids and check blood pressure-if less than 100/60, notify clinic. If symptoms do not improve by end of week, notify clinic. With any sudden onset of chest pain or shortness of breath, go to ER.           Follow up plan: Return if symptoms worsen or fail to improve.  This visit was completed via telephone due to the restrictions of the COVID-19 pandemic. All issues as above were discussed and addressed but no physical exam was performed. If it was felt that the patient should be evaluated in the office, they were directed there. The patient verbally consented to this visit. Patient was unable to complete an audio/visual visit due to Lack of equipment. . Location of the patient: home . Location of the provider:  work . Those involved with this call:  . Provider: Mardene Celeste, DNP . CMA: Wilhemena Durie, CMA . Front Desk/Registration: Harriet Pho  . Time spent on call: 8 minutes on the phone discussing health concerns. 15 minutes total spent in review of patient's record and preparation of their chart.  I verified patient identity using two factors (patient name and date of birth). Patient consents verbally to being seen via telemedicine visit today.

## 2019-12-28 LAB — CULTURE, BLOOD (ROUTINE X 2)
Culture: NO GROWTH
Special Requests: ADEQUATE

## 2020-01-23 ENCOUNTER — Ambulatory Visit: Payer: Self-pay | Admitting: Nurse Practitioner

## 2020-03-06 ENCOUNTER — Other Ambulatory Visit: Payer: Self-pay | Admitting: Family Medicine

## 2020-03-06 DIAGNOSIS — Z1231 Encounter for screening mammogram for malignant neoplasm of breast: Secondary | ICD-10-CM

## 2020-04-09 ENCOUNTER — Ambulatory Visit
Admission: RE | Admit: 2020-04-09 | Discharge: 2020-04-09 | Disposition: A | Discharge status: Home / Self Care | Payer: Medicare Other | Source: Ambulatory Visit | Attending: Family Medicine | Admitting: Family Medicine

## 2020-04-09 ENCOUNTER — Other Ambulatory Visit: Payer: Self-pay

## 2020-04-09 DIAGNOSIS — Z1231 Encounter for screening mammogram for malignant neoplasm of breast: Secondary | ICD-10-CM | POA: Insufficient documentation

## 2021-03-04 ENCOUNTER — Other Ambulatory Visit: Payer: Self-pay | Admitting: Family Medicine

## 2021-03-04 DIAGNOSIS — Z1231 Encounter for screening mammogram for malignant neoplasm of breast: Secondary | ICD-10-CM

## 2021-03-12 DIAGNOSIS — J309 Allergic rhinitis, unspecified: Secondary | ICD-10-CM | POA: Diagnosis not present

## 2021-03-12 DIAGNOSIS — H903 Sensorineural hearing loss, bilateral: Secondary | ICD-10-CM | POA: Diagnosis not present

## 2021-03-12 DIAGNOSIS — H6123 Impacted cerumen, bilateral: Secondary | ICD-10-CM | POA: Diagnosis not present

## 2021-04-15 ENCOUNTER — Ambulatory Visit
Admission: RE | Admit: 2021-04-15 | Discharge: 2021-04-15 | Disposition: A | Discharge status: Home / Self Care | Payer: No Typology Code available for payment source | Source: Ambulatory Visit | Attending: Family Medicine | Admitting: Family Medicine

## 2021-04-15 ENCOUNTER — Other Ambulatory Visit: Payer: Self-pay

## 2021-04-15 DIAGNOSIS — Z1231 Encounter for screening mammogram for malignant neoplasm of breast: Secondary | ICD-10-CM | POA: Insufficient documentation

## 2021-04-17 ENCOUNTER — Other Ambulatory Visit: Payer: Self-pay | Admitting: Family Medicine

## 2021-04-17 DIAGNOSIS — N6310 Unspecified lump in the right breast, unspecified quadrant: Secondary | ICD-10-CM

## 2021-04-18 DIAGNOSIS — R002 Palpitations: Secondary | ICD-10-CM | POA: Diagnosis not present

## 2021-05-06 ENCOUNTER — Ambulatory Visit
Admission: RE | Admit: 2021-05-06 | Discharge: 2021-05-06 | Disposition: A | Discharge status: Home / Self Care | Payer: No Typology Code available for payment source | Source: Ambulatory Visit | Attending: Family Medicine | Admitting: Family Medicine

## 2021-05-06 ENCOUNTER — Other Ambulatory Visit: Payer: Self-pay

## 2021-05-06 DIAGNOSIS — N6011 Diffuse cystic mastopathy of right breast: Secondary | ICD-10-CM | POA: Diagnosis not present

## 2021-05-06 DIAGNOSIS — N6310 Unspecified lump in the right breast, unspecified quadrant: Secondary | ICD-10-CM

## 2021-05-06 DIAGNOSIS — R928 Other abnormal and inconclusive findings on diagnostic imaging of breast: Secondary | ICD-10-CM | POA: Diagnosis not present

## 2021-05-06 DIAGNOSIS — N6315 Unspecified lump in the right breast, overlapping quadrants: Secondary | ICD-10-CM | POA: Insufficient documentation

## 2021-06-10 DIAGNOSIS — H903 Sensorineural hearing loss, bilateral: Secondary | ICD-10-CM | POA: Diagnosis not present

## 2021-06-10 DIAGNOSIS — J309 Allergic rhinitis, unspecified: Secondary | ICD-10-CM | POA: Diagnosis not present

## 2021-06-10 DIAGNOSIS — H6123 Impacted cerumen, bilateral: Secondary | ICD-10-CM | POA: Diagnosis not present

## 2021-06-22 DIAGNOSIS — S91331A Puncture wound without foreign body, right foot, initial encounter: Secondary | ICD-10-CM | POA: Diagnosis not present

## 2021-07-11 ENCOUNTER — Other Ambulatory Visit: Payer: Self-pay | Admitting: Family Medicine

## 2021-07-11 DIAGNOSIS — N6310 Unspecified lump in the right breast, unspecified quadrant: Secondary | ICD-10-CM

## 2021-09-09 DIAGNOSIS — H6123 Impacted cerumen, bilateral: Secondary | ICD-10-CM | POA: Diagnosis not present

## 2021-09-09 DIAGNOSIS — J309 Allergic rhinitis, unspecified: Secondary | ICD-10-CM | POA: Diagnosis not present

## 2021-09-09 DIAGNOSIS — H903 Sensorineural hearing loss, bilateral: Secondary | ICD-10-CM | POA: Diagnosis not present

## 2021-09-09 DIAGNOSIS — Z136 Encounter for screening for cardiovascular disorders: Secondary | ICD-10-CM | POA: Diagnosis not present

## 2021-09-09 DIAGNOSIS — Z0000 Encounter for general adult medical examination without abnormal findings: Secondary | ICD-10-CM | POA: Diagnosis not present

## 2021-09-16 DIAGNOSIS — Z780 Asymptomatic menopausal state: Secondary | ICD-10-CM | POA: Diagnosis not present

## 2021-09-16 DIAGNOSIS — Z0000 Encounter for general adult medical examination without abnormal findings: Secondary | ICD-10-CM | POA: Diagnosis not present

## 2021-09-16 DIAGNOSIS — R030 Elevated blood-pressure reading, without diagnosis of hypertension: Secondary | ICD-10-CM | POA: Diagnosis not present

## 2021-10-14 DIAGNOSIS — Z961 Presence of intraocular lens: Secondary | ICD-10-CM | POA: Diagnosis not present

## 2021-10-29 DIAGNOSIS — M810 Age-related osteoporosis without current pathological fracture: Secondary | ICD-10-CM | POA: Diagnosis not present

## 2021-11-11 ENCOUNTER — Ambulatory Visit
Admission: RE | Admit: 2021-11-11 | Discharge: 2021-11-11 | Disposition: A | Discharge status: Home / Self Care | Payer: No Typology Code available for payment source | Source: Ambulatory Visit | Attending: Family Medicine | Admitting: Family Medicine

## 2021-11-11 DIAGNOSIS — N6310 Unspecified lump in the right breast, unspecified quadrant: Secondary | ICD-10-CM | POA: Insufficient documentation

## 2021-11-11 DIAGNOSIS — R922 Inconclusive mammogram: Secondary | ICD-10-CM | POA: Diagnosis not present

## 2021-11-11 DIAGNOSIS — R928 Other abnormal and inconclusive findings on diagnostic imaging of breast: Secondary | ICD-10-CM | POA: Diagnosis not present

## 2021-11-11 DIAGNOSIS — N6001 Solitary cyst of right breast: Secondary | ICD-10-CM | POA: Diagnosis not present

## 2021-11-28 DIAGNOSIS — M810 Age-related osteoporosis without current pathological fracture: Secondary | ICD-10-CM | POA: Diagnosis not present

## 2021-11-28 DIAGNOSIS — Z008 Encounter for other general examination: Secondary | ICD-10-CM | POA: Diagnosis not present

## 2021-11-28 DIAGNOSIS — Z6822 Body mass index (BMI) 22.0-22.9, adult: Secondary | ICD-10-CM | POA: Diagnosis not present

## 2021-11-28 DIAGNOSIS — K219 Gastro-esophageal reflux disease without esophagitis: Secondary | ICD-10-CM | POA: Diagnosis not present

## 2021-12-06 IMAGING — DX DG CHEST 2V
2 series · 2 of 2 positions shown · non-contrast
Comparison: 03/06/2015

CLINICAL DATA: Weakness and fever for 2 weeks.

EXAM:
CHEST - 2 VIEW

[chest pa]
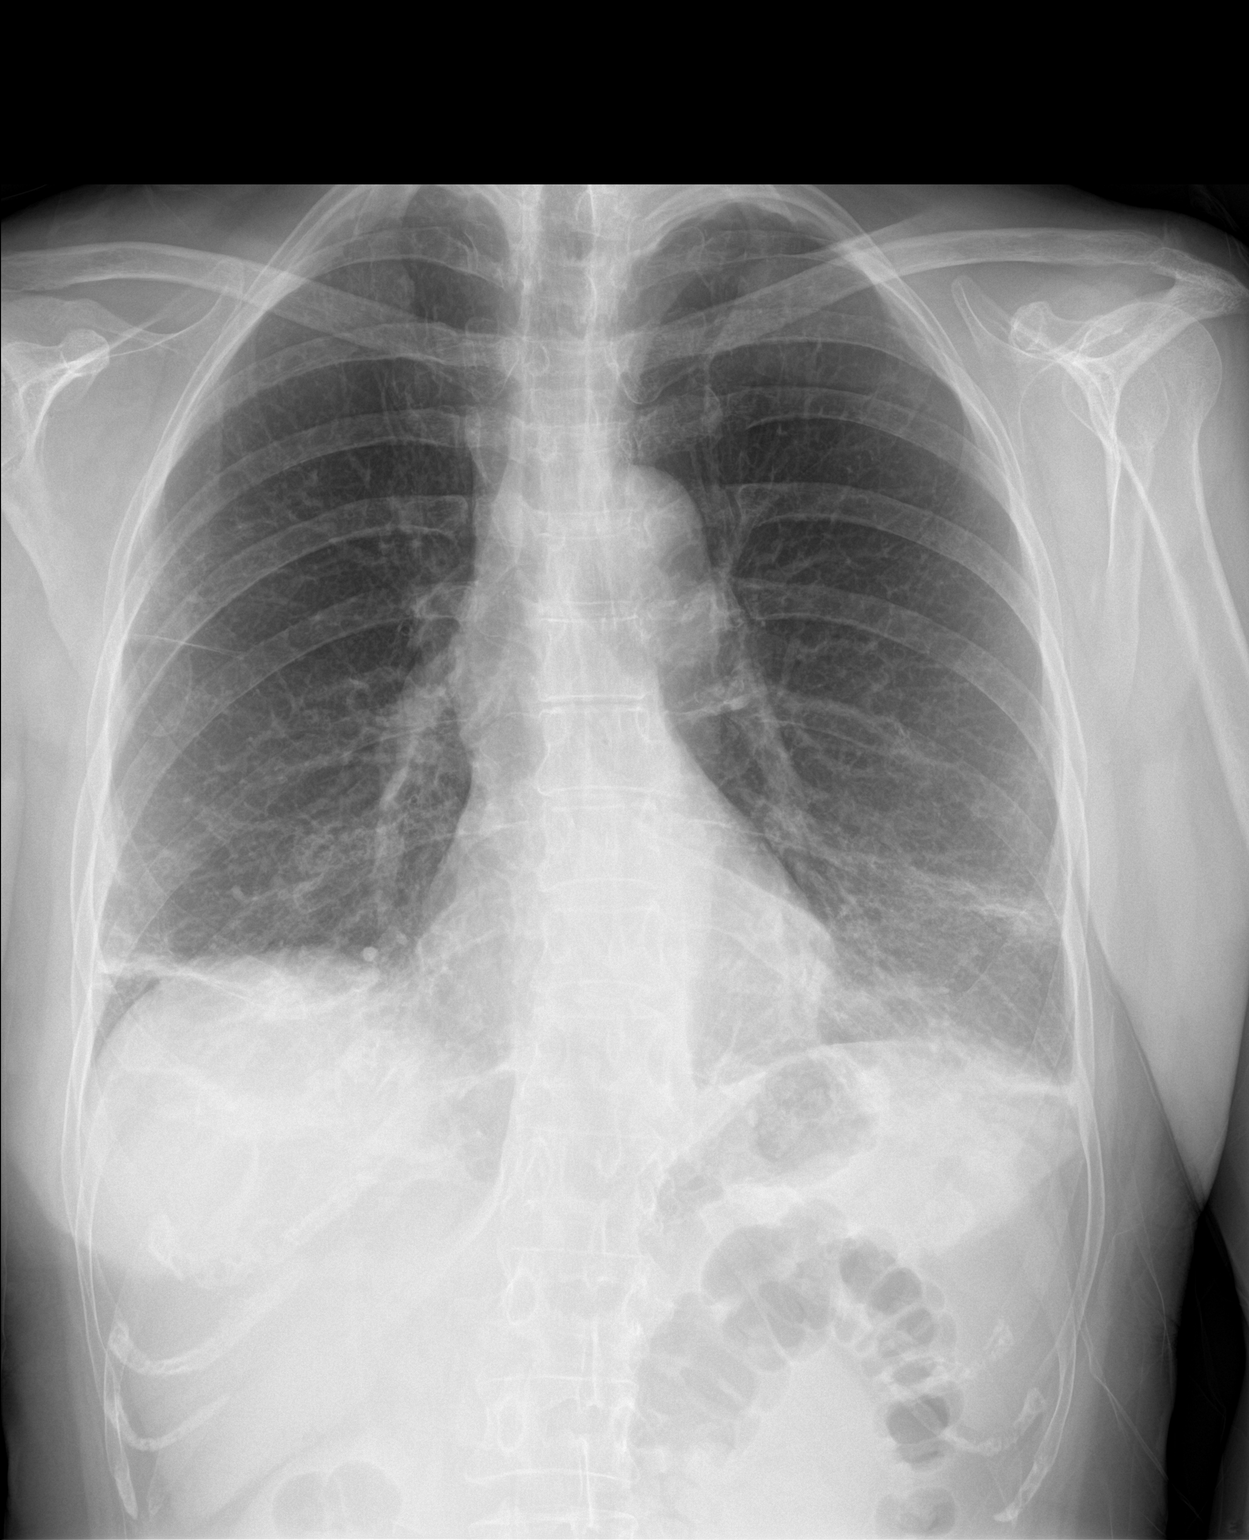

[chest lat]
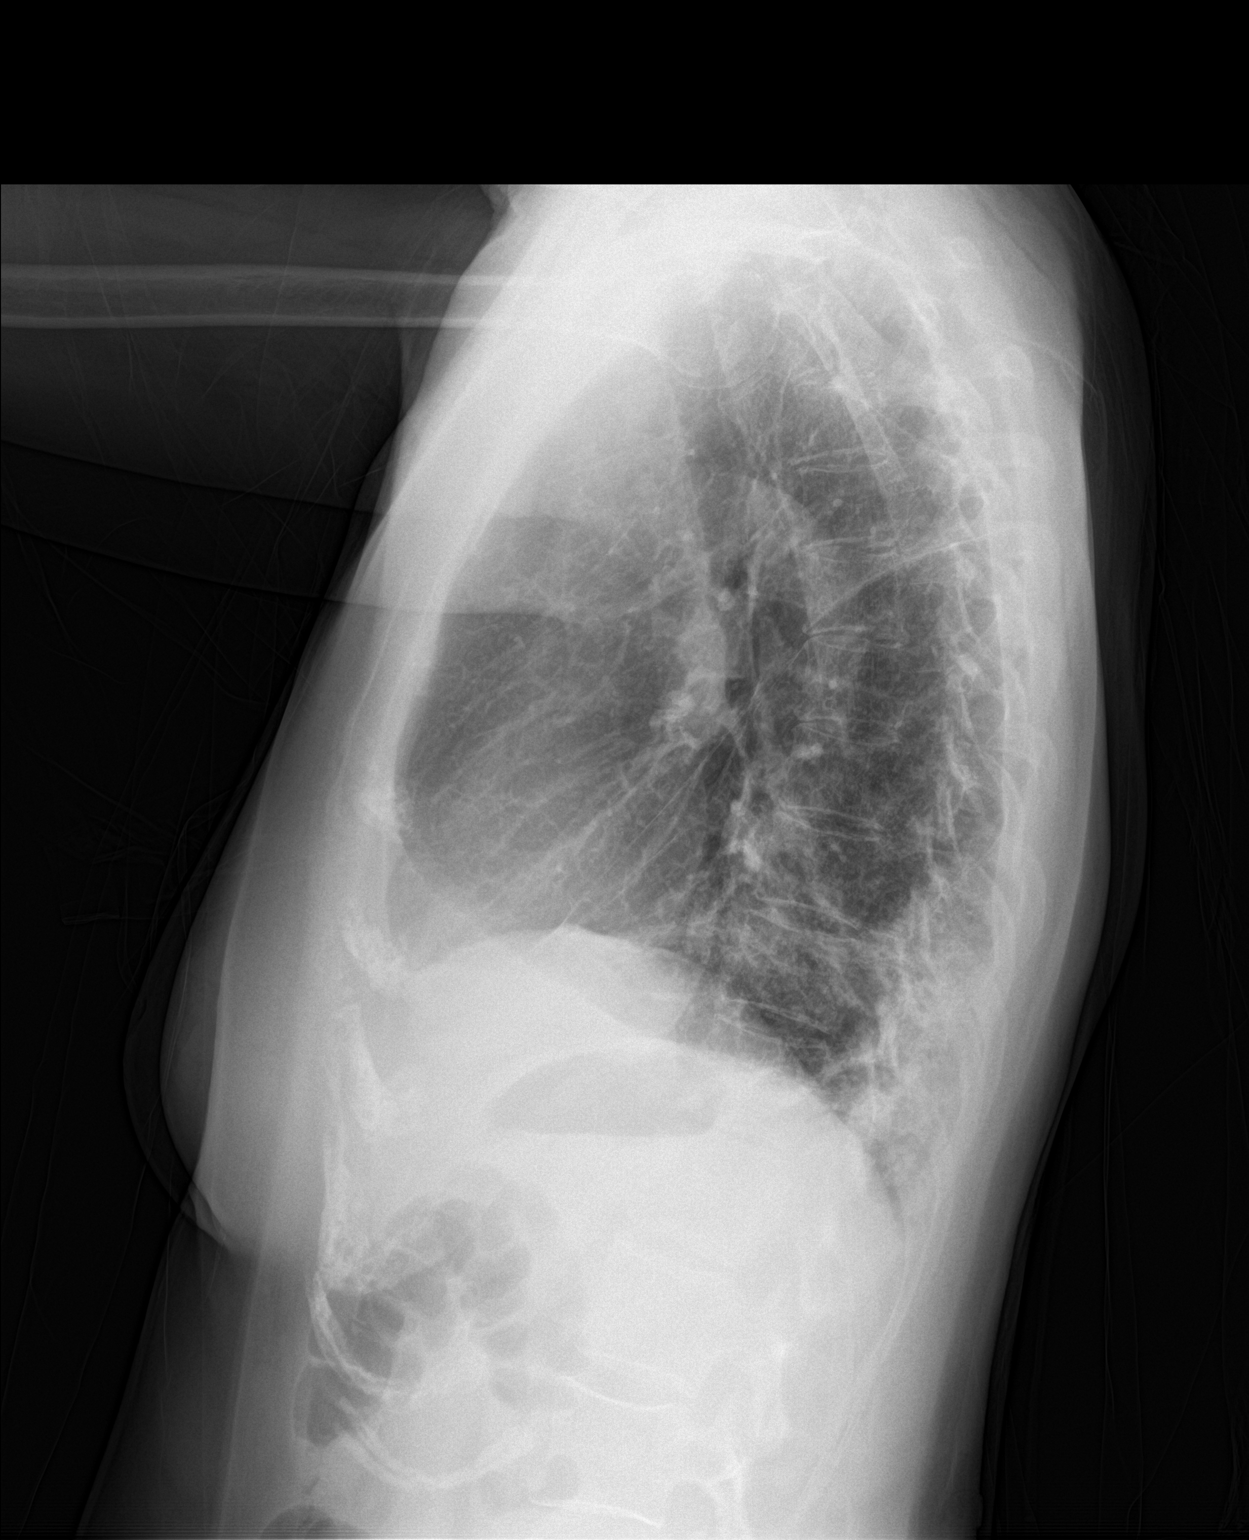

[2 of 2 positions shown; findings below may reference images not displayed]

FINDINGS: Patchy heterogeneous bilateral airspace opacities in a mid-lower
lung zone predominant distribution. Heart is normal in size. Normal
mediastinal contours. No pleural effusion, pneumothorax, evidence of
pneumomediastinum or pulmonary edema. No acute osseous abnormalities
are seen.
IMPRESSION: Patchy heterogeneous bilateral airspace opacities in a mid-lower
lung zone predominant distribution, consistent with multifocal
pneumonia, findings typical of [id number].

## 2021-12-07 IMAGING — DX DG CHEST 1V PORT
1 series · 1 of 1 positions shown · non-contrast
Comparison: Radiograph yesterday.

CLINICAL DATA: Suspected sepsis. COVID positive yesterday.
Vomiting. Nausea. Body aches.

EXAM:
PORTABLE CHEST 1 VIEW

[chest ap]
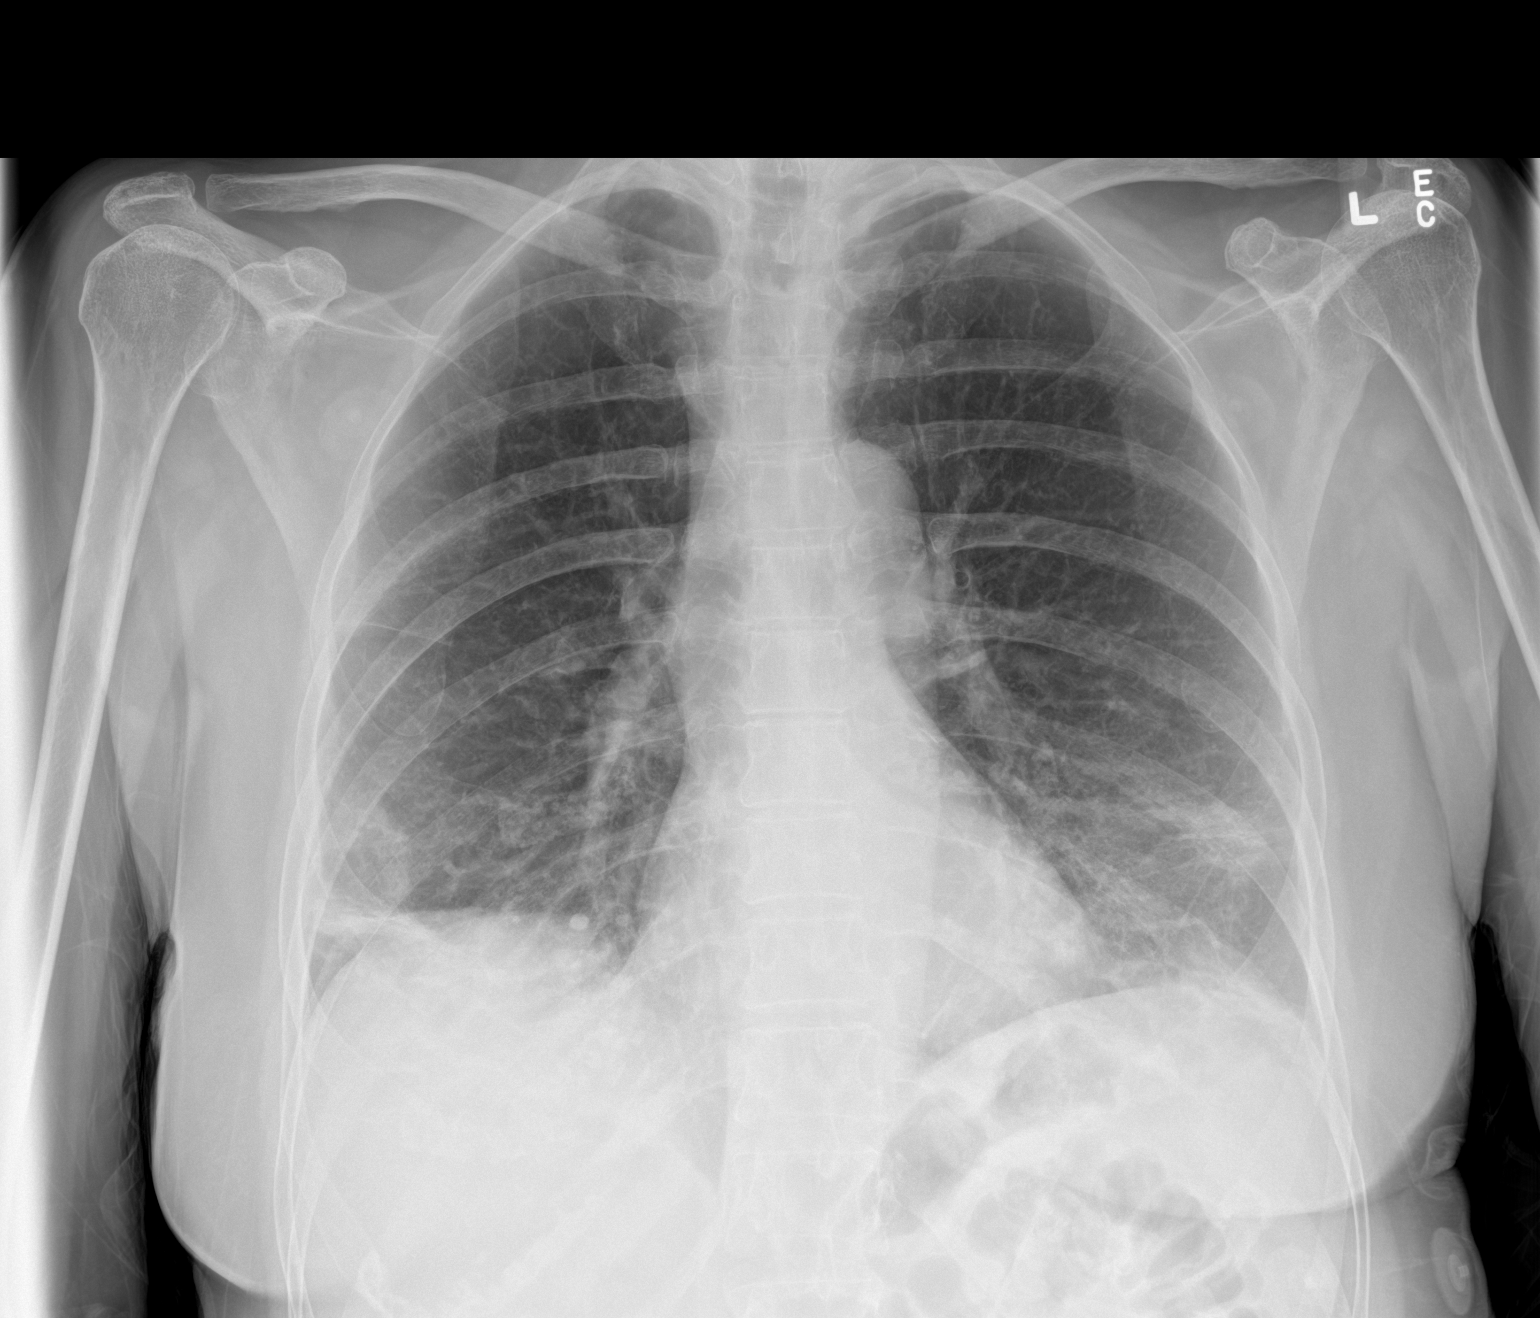

[1 of 1 positions shown; findings below may reference images not displayed]

FINDINGS: Patchy heterogeneous bilateral airspace opacities in a mid-lower
lung zone predominant distribution. No significant change from
radiograph yesterday. The heart is normal in size with unchanged
mediastinal contours. No pleural fluid. No pulmonary edema. No
pneumothorax or evidence of pneumomediastinum. No acute osseous
abnormalities are seen.
IMPRESSION: Patchy bilateral airspace disease consistent with COVID pneumonia.
No significant change from radiographs yesterday.

## 2021-12-09 DIAGNOSIS — H6123 Impacted cerumen, bilateral: Secondary | ICD-10-CM | POA: Diagnosis not present

## 2021-12-09 DIAGNOSIS — H903 Sensorineural hearing loss, bilateral: Secondary | ICD-10-CM | POA: Diagnosis not present

## 2021-12-09 DIAGNOSIS — J309 Allergic rhinitis, unspecified: Secondary | ICD-10-CM | POA: Diagnosis not present

## 2022-02-20 ENCOUNTER — Other Ambulatory Visit: Payer: Self-pay | Admitting: Family Medicine

## 2022-02-20 DIAGNOSIS — N6310 Unspecified lump in the right breast, unspecified quadrant: Secondary | ICD-10-CM

## 2022-03-17 DIAGNOSIS — J309 Allergic rhinitis, unspecified: Secondary | ICD-10-CM | POA: Diagnosis not present

## 2022-03-17 DIAGNOSIS — H903 Sensorineural hearing loss, bilateral: Secondary | ICD-10-CM | POA: Diagnosis not present

## 2022-03-17 DIAGNOSIS — H6123 Impacted cerumen, bilateral: Secondary | ICD-10-CM | POA: Diagnosis not present

## 2022-03-26 DIAGNOSIS — L821 Other seborrheic keratosis: Secondary | ICD-10-CM | POA: Diagnosis not present

## 2022-03-26 DIAGNOSIS — L708 Other acne: Secondary | ICD-10-CM | POA: Diagnosis not present

## 2022-03-26 DIAGNOSIS — L578 Other skin changes due to chronic exposure to nonionizing radiation: Secondary | ICD-10-CM | POA: Diagnosis not present

## 2022-03-26 DIAGNOSIS — D2261 Melanocytic nevi of right upper limb, including shoulder: Secondary | ICD-10-CM | POA: Diagnosis not present

## 2022-03-26 DIAGNOSIS — D2272 Melanocytic nevi of left lower limb, including hip: Secondary | ICD-10-CM | POA: Diagnosis not present

## 2022-03-26 DIAGNOSIS — D225 Melanocytic nevi of trunk: Secondary | ICD-10-CM | POA: Diagnosis not present

## 2022-03-26 DIAGNOSIS — D2271 Melanocytic nevi of right lower limb, including hip: Secondary | ICD-10-CM | POA: Diagnosis not present

## 2022-03-26 DIAGNOSIS — D485 Neoplasm of uncertain behavior of skin: Secondary | ICD-10-CM | POA: Diagnosis not present

## 2022-03-26 DIAGNOSIS — D2262 Melanocytic nevi of left upper limb, including shoulder: Secondary | ICD-10-CM | POA: Diagnosis not present

## 2022-04-21 ENCOUNTER — Ambulatory Visit
Admission: RE | Admit: 2022-04-21 | Discharge: 2022-04-21 | Disposition: A | Discharge status: Home / Self Care | Payer: No Typology Code available for payment source | Source: Ambulatory Visit | Attending: Family Medicine | Admitting: Family Medicine

## 2022-04-21 ENCOUNTER — Other Ambulatory Visit: Payer: Self-pay | Admitting: Family Medicine

## 2022-04-21 DIAGNOSIS — R92323 Mammographic fibroglandular density, bilateral breasts: Secondary | ICD-10-CM | POA: Insufficient documentation

## 2022-04-21 DIAGNOSIS — Z1239 Encounter for other screening for malignant neoplasm of breast: Secondary | ICD-10-CM | POA: Insufficient documentation

## 2022-04-21 DIAGNOSIS — N6310 Unspecified lump in the right breast, unspecified quadrant: Secondary | ICD-10-CM | POA: Insufficient documentation

## 2022-04-21 DIAGNOSIS — R928 Other abnormal and inconclusive findings on diagnostic imaging of breast: Secondary | ICD-10-CM | POA: Diagnosis not present

## 2022-04-21 DIAGNOSIS — N6313 Unspecified lump in the right breast, lower outer quadrant: Secondary | ICD-10-CM | POA: Diagnosis not present

## 2022-06-16 DIAGNOSIS — H6123 Impacted cerumen, bilateral: Secondary | ICD-10-CM | POA: Diagnosis not present

## 2022-06-16 DIAGNOSIS — J309 Allergic rhinitis, unspecified: Secondary | ICD-10-CM | POA: Diagnosis not present

## 2022-06-16 DIAGNOSIS — H903 Sensorineural hearing loss, bilateral: Secondary | ICD-10-CM | POA: Diagnosis not present

## 2022-07-01 DIAGNOSIS — M25551 Pain in right hip: Secondary | ICD-10-CM | POA: Diagnosis not present

## 2022-07-01 DIAGNOSIS — M25552 Pain in left hip: Secondary | ICD-10-CM | POA: Diagnosis not present

## 2022-09-11 DIAGNOSIS — R739 Hyperglycemia, unspecified: Secondary | ICD-10-CM | POA: Diagnosis not present

## 2022-09-11 DIAGNOSIS — Z79899 Other long term (current) drug therapy: Secondary | ICD-10-CM | POA: Diagnosis not present

## 2022-09-15 DIAGNOSIS — J309 Allergic rhinitis, unspecified: Secondary | ICD-10-CM | POA: Diagnosis not present

## 2022-09-15 DIAGNOSIS — H6123 Impacted cerumen, bilateral: Secondary | ICD-10-CM | POA: Diagnosis not present

## 2022-09-18 DIAGNOSIS — R7303 Prediabetes: Secondary | ICD-10-CM | POA: Diagnosis not present

## 2022-09-18 DIAGNOSIS — Z1211 Encounter for screening for malignant neoplasm of colon: Secondary | ICD-10-CM | POA: Diagnosis not present

## 2022-09-18 DIAGNOSIS — Z0000 Encounter for general adult medical examination without abnormal findings: Secondary | ICD-10-CM | POA: Diagnosis not present

## 2022-09-18 DIAGNOSIS — R8271 Bacteriuria: Secondary | ICD-10-CM | POA: Diagnosis not present

## 2022-09-27 DIAGNOSIS — Z1211 Encounter for screening for malignant neoplasm of colon: Secondary | ICD-10-CM | POA: Diagnosis not present

## 2022-10-09 LAB — EXTERNAL GENERIC LAB PROCEDURE: COLOGUARD: NEGATIVE

## 2022-10-09 LAB — COLOGUARD: COLOGUARD: NEGATIVE

## 2022-10-21 DIAGNOSIS — Z961 Presence of intraocular lens: Secondary | ICD-10-CM | POA: Diagnosis not present

## 2022-12-08 DIAGNOSIS — H6123 Impacted cerumen, bilateral: Secondary | ICD-10-CM | POA: Diagnosis not present

## 2022-12-08 DIAGNOSIS — J309 Allergic rhinitis, unspecified: Secondary | ICD-10-CM | POA: Diagnosis not present

## 2023-03-06 ENCOUNTER — Other Ambulatory Visit: Payer: Self-pay | Admitting: Family Medicine

## 2023-03-06 DIAGNOSIS — N63 Unspecified lump in unspecified breast: Secondary | ICD-10-CM

## 2023-04-27 ENCOUNTER — Ambulatory Visit
Admission: RE | Admit: 2023-04-27 | Discharge: 2023-04-27 | Disposition: A | Discharge status: Home / Self Care | Payer: No Typology Code available for payment source | Source: Ambulatory Visit | Attending: Family Medicine | Admitting: Family Medicine

## 2023-04-27 DIAGNOSIS — N63 Unspecified lump in unspecified breast: Secondary | ICD-10-CM

## 2023-04-27 DIAGNOSIS — N6001 Solitary cyst of right breast: Secondary | ICD-10-CM | POA: Insufficient documentation

## 2023-04-27 DIAGNOSIS — N6313 Unspecified lump in the right breast, lower outer quadrant: Secondary | ICD-10-CM | POA: Diagnosis present

## 2024-02-22 ENCOUNTER — Ambulatory Visit

## 2024-02-22 DIAGNOSIS — D229 Melanocytic nevi, unspecified: Secondary | ICD-10-CM

## 2024-02-22 DIAGNOSIS — L578 Other skin changes due to chronic exposure to nonionizing radiation: Secondary | ICD-10-CM

## 2024-02-22 DIAGNOSIS — W908XXA Exposure to other nonionizing radiation, initial encounter: Secondary | ICD-10-CM

## 2024-02-22 DIAGNOSIS — D489 Neoplasm of uncertain behavior, unspecified: Secondary | ICD-10-CM | POA: Diagnosis not present

## 2024-02-22 DIAGNOSIS — D3611 Benign neoplasm of peripheral nerves and autonomic nervous system of face, head, and neck: Secondary | ICD-10-CM | POA: Diagnosis not present

## 2024-02-22 DIAGNOSIS — D1801 Hemangioma of skin and subcutaneous tissue: Secondary | ICD-10-CM

## 2024-02-22 DIAGNOSIS — L821 Other seborrheic keratosis: Secondary | ICD-10-CM

## 2024-02-22 NOTE — Patient Instructions (Signed)

## 2024-02-22 NOTE — Progress Notes (Signed)
" °  °  Subjective   Christina Webb is a 74 y.o. female who presents for the following: Lesion(s) of concern . Patient is new patient  Today patient reports: LOC on the chin LOC on the chest   Review of Systems:    No other skin or systemic complaints except as noted in HPI or Assessment and Plan.  The following portions of the chart were reviewed this encounter and updated as appropriate: medications, allergies, medical history  Relevant Medical History:  n/a   Objective  (SKPE) Well appearing patient in no apparent distress; mood and affect are within normal limits. Examination was performed of the: Focused Exam of: Face, chest, abdomen   Examination notable for: Angioma(s): Scattered red vascular papule(s)  , Nevus/nevi: Scattered well-demarcated, regular, pigmented macule(s) and/or papule(s)  , Seborrheic Keratosis(es): Stuck-on appearing keratotic papule(s) on the trunk, none  irritated with redness, crusting, edema, and/or partial avulsion, Actinic Damage/Elastosis: chronic sun damage: dyspigmentation, telangiectasia, and wrinkling  Examination limited by: Clothing and Patient deferred removal     Right Anterior Mandible 5mm skin colored papule   Assessment & Plan  (SKAP)    BENIGN SKIN FINDINGS  - Seborrheic keratoses  - Hemangiomas   - Nevus/Multiple Benign Nevi - Reassurance provided regarding the benign appearance of lesions noted on exam today; no treatment is indicated in the absence of symptoms/changes. - Reinforced importance of photoprotective strategies including liberal and frequent sunscreen use of a broad-spectrum SPF 30 or greater, use of protective clothing, and sun avoidance for prevention of cutaneous malignancy and photoaging.  Counseled patient on the importance of regular self-skin monitoring as well as routine clinical skin examinations as scheduled.   ACTINIC DAMAGE - Chronic condition, secondary to cumulative UV/sun exposure - Recommend daily broad  spectrum sunscreen SPF 30+ to sun-exposed areas, reapply every 2 hours as needed.  - Staying in the shade or wearing long sleeves, sun glasses (UVA+UVB protection) and wide brim hats (4-inch brim around the entire circumference of the hat) are also recommended for sun protection.  - Call for new or changing lesions.   Was sun protection counseling provided?: No   Level of service outlined above   Patient instructions (SKPI)   Procedures, orders, diagnosis for this visit:  NEOPLASM OF UNCERTAIN BEHAVIOR Right Anterior Mandible - Epidermal / dermal shaving  Lesion diameter (cm):  0.5 Informed consent: discussed and consent obtained   Timeout: patient name, date of birth, surgical site, and procedure verified   Procedure prep:  Patient was prepped and draped in usual sterile fashion Prep type:  Isopropyl alcohol Anesthesia: the lesion was anesthetized in a standard fashion   Anesthetic:  1% lidocaine w/ epinephrine  1-100,000 buffered w/ 8.4% NaHCO3 Instrument used: DermaBlade   Hemostasis achieved with: pressure and aluminum chloride   Outcome: patient tolerated procedure well   Post-procedure details: wound care instructions given    This Visit - Anatomic Pathology Report  Neoplasm of uncertain behavior -     Epidermal / dermal shaving -     Anatomic Pathology Report    Return to clinic: Return if symptoms worsen or fail to improve.  I, Emerick Ege, CMA am acting as scribe for Lauraine JAYSON Kanaris, MD.  Documentation: I have reviewed the above documentation for accuracy and completeness, and I agree with the above.  Lauraine JAYSON Kanaris, MD  "

## 2024-03-03 ENCOUNTER — Ambulatory Visit: Payer: Self-pay

## 2024-03-03 LAB — ANATOMIC PATHOLOGY REPORT

## 2024-03-03 NOTE — Progress Notes (Signed)
Patient informed and voiced good understanding.
